# Patient Record
Sex: Female | Born: 1950 | Race: White | Hispanic: No
Health system: Southern US, Community
[De-identification: ages and names within clinical notes are randomized; demographics above are authoritative.]

## PROBLEM LIST (undated history)

## (undated) DIAGNOSIS — D709 Neutropenia, unspecified: Secondary | ICD-10-CM

## (undated) DIAGNOSIS — I4891 Unspecified atrial fibrillation: Secondary | ICD-10-CM

## (undated) DIAGNOSIS — J449 Chronic obstructive pulmonary disease, unspecified: Secondary | ICD-10-CM

## (undated) DIAGNOSIS — F259 Schizoaffective disorder, unspecified: Secondary | ICD-10-CM

## (undated) DIAGNOSIS — K219 Gastro-esophageal reflux disease without esophagitis: Secondary | ICD-10-CM

---

## 2021-12-15 ENCOUNTER — Encounter (HOSPITAL_COMMUNITY): Payer: Self-pay

## 2021-12-15 ENCOUNTER — Emergency Department (HOSPITAL_COMMUNITY): Payer: Medicare Other

## 2021-12-15 ENCOUNTER — Emergency Department (HOSPITAL_COMMUNITY)
Admission: EM | Admit: 2021-12-15 | Discharge: 2021-12-15 | Disposition: A | Discharge status: Home / Self Care | Payer: Medicare Other | Attending: Emergency Medicine | Admitting: Emergency Medicine

## 2021-12-15 ENCOUNTER — Other Ambulatory Visit: Payer: Self-pay

## 2021-12-15 DIAGNOSIS — W19XXXA Unspecified fall, initial encounter: Secondary | ICD-10-CM

## 2021-12-15 DIAGNOSIS — N3000 Acute cystitis without hematuria: Secondary | ICD-10-CM | POA: Insufficient documentation

## 2021-12-15 DIAGNOSIS — R739 Hyperglycemia, unspecified: Secondary | ICD-10-CM | POA: Diagnosis not present

## 2021-12-15 DIAGNOSIS — N179 Acute kidney failure, unspecified: Secondary | ICD-10-CM | POA: Insufficient documentation

## 2021-12-15 DIAGNOSIS — R531 Weakness: Secondary | ICD-10-CM | POA: Diagnosis not present

## 2021-12-15 DIAGNOSIS — W01198A Fall on same level from slipping, tripping and stumbling with subsequent striking against other object, initial encounter: Secondary | ICD-10-CM | POA: Insufficient documentation

## 2021-12-15 DIAGNOSIS — R112 Nausea with vomiting, unspecified: Secondary | ICD-10-CM | POA: Diagnosis present

## 2021-12-15 LAB — URINALYSIS, ROUTINE W REFLEX MICROSCOPIC
Bilirubin Urine: NEGATIVE
Glucose, UA: NEGATIVE mg/dL
Hgb urine dipstick: NEGATIVE
Ketones, ur: NEGATIVE mg/dL
Nitrite: NEGATIVE
Protein, ur: NEGATIVE mg/dL
Specific Gravity, Urine: 1.013 (ref 1.005–1.030)
pH: 6 (ref 5.0–8.0)

## 2021-12-15 LAB — CBC WITH DIFFERENTIAL/PLATELET
Abs Immature Granulocytes: 0.02 10*3/uL (ref 0.00–0.07)
Basophils Absolute: 0.1 10*3/uL (ref 0.0–0.1)
Basophils Relative: 1 %
Eosinophils Absolute: 0.1 10*3/uL (ref 0.0–0.5)
Eosinophils Relative: 1 %
HCT: 43.5 % (ref 36.0–46.0)
Hemoglobin: 14.3 g/dL (ref 12.0–15.0)
Immature Granulocytes: 0 %
Lymphocytes Relative: 16 %
Lymphs Abs: 1.3 10*3/uL (ref 0.7–4.0)
MCH: 32.1 pg (ref 26.0–34.0)
MCHC: 32.9 g/dL (ref 30.0–36.0)
MCV: 97.8 fL (ref 80.0–100.0)
Monocytes Absolute: 0.6 10*3/uL (ref 0.1–1.0)
Monocytes Relative: 8 %
Neutro Abs: 6.1 10*3/uL (ref 1.7–7.7)
Neutrophils Relative %: 74 %
Platelets: 183 10*3/uL (ref 150–400)
RBC: 4.45 MIL/uL (ref 3.87–5.11)
RDW: 14.2 % (ref 11.5–15.5)
WBC: 8.1 10*3/uL (ref 4.0–10.5)
nRBC: 0 % (ref 0.0–0.2)

## 2021-12-15 LAB — COMPREHENSIVE METABOLIC PANEL
ALT: 12 U/L (ref 0–44)
AST: 22 U/L (ref 15–41)
Albumin: 3.8 g/dL (ref 3.5–5.0)
Alkaline Phosphatase: 53 U/L (ref 38–126)
Anion gap: 8 (ref 5–15)
BUN: 22 mg/dL (ref 8–23)
CO2: 23 mmol/L (ref 22–32)
Calcium: 9.4 mg/dL (ref 8.9–10.3)
Chloride: 111 mmol/L (ref 98–111)
Creatinine, Ser: 1.42 mg/dL — ABNORMAL HIGH (ref 0.44–1.00)
GFR, Estimated: 39 mL/min — ABNORMAL LOW (ref >60)
Glucose, Bld: 132 mg/dL — ABNORMAL HIGH (ref 70–99)
Potassium: 3.7 mmol/L (ref 3.5–5.1)
Sodium: 142 mmol/L (ref 135–145)
Total Bilirubin: 0.9 mg/dL (ref 0.3–1.2)
Total Protein: 7.2 g/dL (ref 6.5–8.1)

## 2021-12-15 LAB — LIPASE, BLOOD: Lipase: 23 U/L (ref 11–51)

## 2021-12-15 MED ORDER — CEPHALEXIN 500 MG PO CAPS: 500.0000 mg | ORAL_CAPSULE | Freq: Two times a day (BID) | ORAL | 0 refills | Status: DC

## 2021-12-15 MED ORDER — SODIUM CHLORIDE 0.9 % IV BOLUS: 1000.0000 mL | Freq: Once | INTRAVENOUS | Status: DC

## 2021-12-15 MED ORDER — IOHEXOL 300 MG/ML  SOLN
80.0000 mL | Freq: Once | INTRAMUSCULAR | Status: AC | PRN
  Administered 2021-12-15: 80 mL via INTRAVENOUS

## 2021-12-15 MED ORDER — SODIUM CHLORIDE (PF) 0.9 % IJ SOLN
INTRAMUSCULAR | Status: AC
  Filled 2021-12-15: qty 50

## 2021-12-15 NOTE — ED Notes (Signed)
Pt had another loose bowel movement, cleaned and pads placed under patient

## 2021-12-15 NOTE — ED Triage Notes (Signed)
BIB EMS from [facility]) for a fall, EMS stated she was covered in stool, states Pt hit head, no blood thinners, patient c/o weakness and states she feels off. Hx Afib  CBG 126

## 2021-12-15 NOTE — ED Provider Notes (Signed)
[facility] DEPT Provider Note   CSN: [id number] Arrival date & time: 12/15/21  0335     History  Chief Complaint  Patient presents with   Deanna Rhodes is a 71 y.o. female.  HPI  Without medical history presents from living facility due to a fall.  Patient states that she has not been feeling well for the last 2 days, she states that she had episodes of significant diarrhea, states is very watery, states on her way to the bathroom she felt weak and fell, states she fell onto her right side, she states that she hit her head but denies losing conscious, she is not on anticoag's.  She denies hematochezia or melena, no history of C. difficile no recent antibiotics no recent hospitalizations, She has felt nauseous and had episode of vomiting today denies hematemesis or coffee-ground emesis, she denies any stomach pain, no significant abdominal surgeries, she denies URI-like symptoms no fevers chills general body aches.  She has no other complaints.  She does not endorse any change in vision paresthesias or weakness of her lower extremities denies any neck back pain chest pain pain in the upper extremities or lower extremities.  Spoke to nursing staff at [facility] that patient had a fall, it was unwitnessed, she was found in the bathroom, there is no loss of conscious, they states the patient has been acting her normal self, has been eating and drink outwithout difficulty, they state that patient informed them that she felt weak to start today.  Home Medications Prior to Admission medications   Medication Sig Start Date End Date Taking? Authorizing Provider  cephALEXin (KEFLEX) 500 MG capsule Take 1 capsule (500 mg total) by mouth 2 (two) times daily for 7 days. 12/15/21 12/22/21 Yes Marcello Fennel, PA-C      Allergies    Patient has no known allergies.    Review of Systems   Review of Systems  Constitutional:  Negative for chills  and fever.  Respiratory:  Negative for shortness of breath.   Cardiovascular:  Negative for chest pain.  Gastrointestinal:  Positive for diarrhea, nausea and vomiting. Negative for abdominal pain.  Neurological:  Positive for weakness. Negative for headaches.   Physical Exam Updated Vital Signs BP 97/62   Pulse 87   Temp 97.9 F (36.6 C) (Oral)   Resp 17   Ht '5\' 5"'  (1.651 m)   Wt 71.7 kg   SpO2 95%   BMI 26.29 kg/m  Physical Exam Vitals and nursing note reviewed.  Constitutional:      General: She is not in acute distress.    Appearance: She is not ill-appearing.  HENT:     Head: Normocephalic and atraumatic.     Comments: No deformity of the head present no raccoon eyes or battle sign noted.    Nose: No congestion.     Mouth/Throat:     Mouth: Mucous membranes are moist.     Pharynx: Oropharynx is clear. No oropharyngeal exudate or posterior oropharyngeal erythema.     Comments: No trismus no torticollis no oral trauma present. Eyes:     Extraocular Movements: Extraocular movements intact.     Conjunctiva/sclera: Conjunctivae normal.     Pupils: Pupils are equal, round, and reactive to light.  Cardiovascular:     Rate and Rhythm: Normal rate and regular rhythm.     Pulses: Normal pulses.     Heart sounds: No murmur heard.   No  friction rub. No gallop.  Pulmonary:     Effort: No respiratory distress.     Breath sounds: No wheezing, rhonchi or rales.  Abdominal:     Palpations: Abdomen is soft.     Tenderness: There is abdominal tenderness. There is no right CVA tenderness or left CVA tenderness.     Comments: Nondistended normal bowel sounds dull to percussion, has noted epigastric tenderness, no guarding rebound as or peritoneal sign negative Murphy sign McBurney point.  Musculoskeletal:     Comments: Spine was palpated tender lower lumbar region no step-off or deformities noted.  No pelvis instability no leg shortening  Skin:    General: Skin is warm and dry.   Neurological:     Mental Status: She is alert.     Comments: Alert and orient x4.  No facial asymmetry no difficulty with word finding following two-step commands no unilateral weakness present.  Psychiatric:        Mood and Affect: Mood normal.    ED Results / Procedures / Treatments   Labs (all labs ordered are listed, but only abnormal results are displayed) Labs Reviewed  COMPREHENSIVE METABOLIC PANEL - Abnormal; Notable for the following components:      Result Value   Glucose, Bld 132 (*)    Creatinine, Ser 1.42 (*)    GFR, Estimated 39 (*)    All other components within normal limits  URINALYSIS, ROUTINE W REFLEX MICROSCOPIC - Abnormal; Notable for the following components:   APPearance HAZY (*)    Leukocytes,Ua LARGE (*)    Bacteria, UA MANY (*)    All other components within normal limits  URINE CULTURE  CBC WITH DIFFERENTIAL/PLATELET  LIPASE, BLOOD    EKG EKG Interpretation  Date/Time:  Thursday Dec 15 2021 04:44:35 EDT Ventricular Rate:  79 PR Interval:  156 QRS Duration: 100 QT Interval:  385 QTC Calculation: 442 R Axis:   -64 Text Interpretation: Sinus rhythm Supraventricular bigeminy Left anterior fascicular block Abnormal R-wave progression, early transition LVH with secondary repolarization abnormality Anterior Q waves, possibly due to LVH No old tracing to compare Confirmed by Calvert Cantor [id number]) on 12/15/2021 5:21:22 AM  Radiology CT Head Wo Contrast  Result Date: 12/15/2021 CLINICAL DATA:  71 year old female status post fall at nursing home. Weakness. EXAM: CT HEAD WITHOUT CONTRAST TECHNIQUE: Contiguous axial images were obtained from the base of the skull through the vertex without intravenous contrast. RADIATION DOSE REDUCTION: This exam was performed according to the departmental dose-optimization program which includes automated exposure control, adjustment of the mA and/or kV according to patient size and/or use of iterative reconstruction  technique. COMPARISON:  None Available. FINDINGS: Brain: Cerebral volume is within normal limits for age. No midline shift, ventriculomegaly, mass effect, evidence of mass lesion, intracranial hemorrhage or evidence of cortically based acute infarction. Minimal to mild for age scattered cerebral white matter hypodensity, mostly subcortical. Mild involvement right anterior internal capsule. Gray-white matter differentiation otherwise within normal limits. Vascular: Minimal Calcified atherosclerosis at the skull base. No suspicious intracranial vascular hyperdensity. Skull: No fracture identified. Hyperostosis of the calvarium, normal variant. Sinuses/Orbits: Paranasal sinuses, tympanic cavities and mastoids are well aerated. Other: No orbit or scalp soft tissue injury identified. IMPRESSION: 1. No acute intracranial abnormality or acute traumatic injury identified. 2. Minimal to mild for age cerebral white matter changes, most commonly due to chronic small vessel disease. Electronically Signed   By: Genevie Ann M.D.   On: 12/15/2021 06:31   CT ABDOMEN PELVIS W  CONTRAST  Result Date: 12/15/2021 CLINICAL DATA:  71 year old female with history of trauma from a fall at a nursing home. Epigastric pain. Weakness. EXAM: CT ABDOMEN AND PELVIS WITH CONTRAST CT LUMBAR SPINE WITHOUT CONTRAST TECHNIQUE: Multidetector CT imaging of the abdomen and pelvis was performed using the standard protocol following bolus administration of intravenous contrast. Additional multiplanar reformats were generated through the lumbar spine for interpretation. RADIATION DOSE REDUCTION: This exam was performed according to the departmental dose-optimization program which includes automated exposure control, adjustment of the mA and/or kV according to patient size and/or use of iterative reconstruction technique. CONTRAST:  20m OMNIPAQUE IOHEXOL 300 MG/ML  SOLN COMPARISON:  No priors. FINDINGS: Lower chest: Densely calcified bilateral breast  implants are incidentally noted. Scattered areas of scarring are noted in the lung bases bilaterally. Hepatobiliary: No suspicious cystic or solid hepatic lesions. No intra or extrahepatic biliary ductal dilatation. Status post cholecystectomy. Pancreas: No pancreatic mass. No pancreatic ductal dilatation. No pancreatic or peripancreatic fluid collections or inflammatory changes. Spleen: Small calcified granulomas in the spleen. Adrenals/Urinary Tract: Mild renal atrophy bilaterally. No suspicious renal lesions. No hydroureteronephrosis. Urinary bladder is normal in appearance. Bilateral adrenal glands are normal in appearance. Stomach/Bowel: The appearance of the stomach is normal. There is no pathologic dilatation of small bowel or colon. Postoperative changes of partial bowel resection are noted at the ileocolic junction. Appendix is not confidently identified may be surgically absent. Vascular/Lymphatic: Aortic atherosclerosis, without evidence of aneurysm or dissection in the abdominal or pelvic vasculature. No lymphadenopathy noted in the abdomen or pelvis. Reproductive: Uterus and ovaries are atrophic. Other: No high attenuation fluid collection in the peritoneal cavity or retroperitoneum to suggest significant posttraumatic hemorrhage. No significant volume of ascites. No pneumoperitoneum. Musculoskeletal: No acute abnormality of the lumbar spine. Specifically, no acute displaced fracture, compression type fracture or malalignment. There are no aggressive appearing lytic or blastic lesions noted in the visualized portions of the skeleton. IMPRESSION: 1. No evidence of significant acute traumatic injury to the lumbar spine. 2. No acute findings are noted in the abdomen or pelvis to account for the patient's symptoms. 3. Aortic atherosclerosis. 4. Additional incidental findings, as above. Electronically Signed   By: DVinnie LangtonM.D.   On: 12/15/2021 06:47   CT L-SPINE NO CHARGE  Result Date:  12/15/2021 CLINICAL DATA:  71year old female with history of trauma from a fall at a nursing home. Epigastric pain. Weakness. EXAM: CT ABDOMEN AND PELVIS WITH CONTRAST CT LUMBAR SPINE WITHOUT CONTRAST TECHNIQUE: Multidetector CT imaging of the abdomen and pelvis was performed using the standard protocol following bolus administration of intravenous contrast. Additional multiplanar reformats were generated through the lumbar spine for interpretation. RADIATION DOSE REDUCTION: This exam was performed according to the departmental dose-optimization program which includes automated exposure control, adjustment of the mA and/or kV according to patient size and/or use of iterative reconstruction technique. CONTRAST:  878mOMNIPAQUE IOHEXOL 300 MG/ML  SOLN COMPARISON:  No priors. FINDINGS: Lower chest: Densely calcified bilateral breast implants are incidentally noted. Scattered areas of scarring are noted in the lung bases bilaterally. Hepatobiliary: No suspicious cystic or solid hepatic lesions. No intra or extrahepatic biliary ductal dilatation. Status post cholecystectomy. Pancreas: No pancreatic mass. No pancreatic ductal dilatation. No pancreatic or peripancreatic fluid collections or inflammatory changes. Spleen: Small calcified granulomas in the spleen. Adrenals/Urinary Tract: Mild renal atrophy bilaterally. No suspicious renal lesions. No hydroureteronephrosis. Urinary bladder is normal in appearance. Bilateral adrenal glands are normal in appearance. Stomach/Bowel: The appearance of the  stomach is normal. There is no pathologic dilatation of small bowel or colon. Postoperative changes of partial bowel resection are noted at the ileocolic junction. Appendix is not confidently identified may be surgically absent. Vascular/Lymphatic: Aortic atherosclerosis, without evidence of aneurysm or dissection in the abdominal or pelvic vasculature. No lymphadenopathy noted in the abdomen or pelvis. Reproductive: Uterus and  ovaries are atrophic. Other: No high attenuation fluid collection in the peritoneal cavity or retroperitoneum to suggest significant posttraumatic hemorrhage. No significant volume of ascites. No pneumoperitoneum. Musculoskeletal: No acute abnormality of the lumbar spine. Specifically, no acute displaced fracture, compression type fracture or malalignment. There are no aggressive appearing lytic or blastic lesions noted in the visualized portions of the skeleton. IMPRESSION: 1. No evidence of significant acute traumatic injury to the lumbar spine. 2. No acute findings are noted in the abdomen or pelvis to account for the patient's symptoms. 3. Aortic atherosclerosis. 4. Additional incidental findings, as above. Electronically Signed   By: Vinnie Langton M.D.   On: 12/15/2021 06:47    Procedures Procedures    Medications Ordered in ED Medications  sodium chloride (PF) 0.9 % injection (has no administration in time range)  sodium chloride 0.9 % bolus 1,000 mL (has no administration in time range)  iohexol (OMNIPAQUE) 300 MG/ML solution 80 mL (80 mLs Intravenous Contrast Given 12/15/21 1638)    ED Course/ Medical Decision Making/ A&P                           Medical Decision Making Amount and/or Complexity of Data Reviewed Labs: ordered. Radiology: ordered.  Risk Prescription drug management.   This patient presents to the ED for concern of fall, this involves an extensive number of treatment options, and is a complaint that carries with it a high risk of complications and morbidity.  The differential diagnosis includes intracranial head bleed, sepsis, electrode derailments    Additional history obtained:  Additional history obtained from nursing staff External records from outside source obtained and reviewed including N/A   Co morbidities that complicate the patient evaluation  N/A  Social Determinants of Health:  Geriatric    Lab Tests:  I Ordered, and personally  interpreted labs.  The pertinent results include: CBC unremarkable, CMP shows glucose of 132 creatinine 1.4, lipase 23, UA shows leukocytes, white blood cells many bacteria, urine culture pending   Imaging Studies ordered:  I ordered imaging studies including CT head, CT lumbar spine, CT abdomen pelvis I independently visualized and interpreted imaging which showed CT head negative acute findings, CT abdomen pelvis as well as lumbar spine negative for acute findings. I agree with the radiologist interpretation   Cardiac Monitoring:  The patient was maintained on a cardiac monitor.  I personally viewed and interpreted the cardiac monitored which showed an underlying rhythm of: EKG without signs of ischemia   Medicines ordered and prescription drug management:  I ordered medication including N/A I have reviewed the patients home medicines and have made adjustments as needed  Critical Interventions:  N/A   Reevaluation:  Presents with a fall, abdomen slight tender my exam, as well as tenderness along her lumbar spine, will obtain imaging and lab work and reassess.  CMP shows increase in creatinine she did appear dehydrated my exam will provide with fluids, UA is consistent with UTI will culture urine.  Update on lab or imaging having no other complaints agreeable for discharge.    Consultations Obtained:  N/A  Considered:  Admission-we will defer admission at this time as patient is nontoxic-appearing vital signs reassuring no leukocytosis, she is not encephalopathic from a UTI.  Will attempt outpatient therapy.  Patient agreement this plan.    Rule out low suspicion for intracranial head bleed and/or CVA as patient denies loss of conscious, is not on anticoagulant, she does not endorse headaches, paresthesia/weakness in the upper and lower extremities, no focal deficits present on my exam CT head negative acute findings..  Low suspicion for spinal cord abnormality or  spinal fracture as patient has full range of motion in the upper and lower extremities CT lumbar spine negative acute findings.  Low suspicion for ACS as patient denies any chest pain shortness of breath EKG without signs of ischemia.  I have low suspicion for pancreatitis as lipase within normal limits.  Low suspicion for biliary abnormality as there is no elevation liver enzymes alk phos T. bili.  Low suspicion for bowel obstruction volvulus intra-abdominal infection CT on pelvis negative for these findings.    Dispostion and problem list  Discharge  Weakness-likely secondary due to UTI, started on antibiotics, follow-up with PCP for further evaluation. AKI-given a liter of fluids, recheck creatinine weeks time for reevaluation.  By PCP.            Final Clinical Impression(s) / ED Diagnoses Final diagnoses:  Fall, initial encounter  Acute cystitis without hematuria  AKI (acute kidney injury) [facility])    Rx / DC Orders ED Discharge Orders          Ordered    cephALEXin (KEFLEX) 500 MG capsule  2 times daily        12/15/21 0700              Marcello Fennel, PA-C 12/15/21 3704    Truddie Hidden, MD 12/15/21 2255

## 2021-12-15 NOTE — ED Notes (Signed)
Son in law called to get information/update

## 2021-12-15 NOTE — ED Notes (Signed)
Pt arrived from facility covered in feces down to her feet, cleaned and brief placed on patient

## 2021-12-15 NOTE — Discharge Instructions (Addendum)
UTI-started on antibiotics take as prescribed please follow-up with PCP in weeks time for recheck of your urine. AKI-given a liter of fluid in the emergency department please continue to hydrate, follow-up with your PCP recheck creatinine.   Come back to the emergency department if you develop chest pain, shortness of breath, severe abdominal pain, uncontrolled nausea, vomiting, diarrhea.

## 2021-12-15 NOTE — ED Notes (Signed)
[facility] transportation set up

## 2021-12-17 LAB — URINE CULTURE: Culture: >=100000 — AB

## 2021-12-18 ENCOUNTER — Telehealth (HOSPITAL_BASED_OUTPATIENT_CLINIC_OR_DEPARTMENT_OTHER): Payer: Self-pay | Admitting: *Deleted

## 2021-12-18 NOTE — Telephone Encounter (Signed)
Post ED Visit - Positive Culture Follow-up  Culture report reviewed by antimicrobial stewardship pharmacist: [facility] []  , Pharm.D. []  Enzo Bi, .D., BCPS AQ-ID []  Celedonio Miyamoto, Pharm.D., BCPS []  [address], Pharm.D., BCPS []  [place], Garvin Fila.D., BCPS, AAHIVP []  , Pharm.D., BCPS, AAHIVP []  Georgina Pillion, PharmD, BCPS []  , PharmD, BCPS []  [place], PharmD, BCPS []  [place], PharmD []  , PharmD, BCPS []  Estella Husk, PharmD  Pharmacy Team []  Lysle Pearl, PharmD []  , PharmD []  Phillips Climes, PharmD []  , Rph []  Agapito Games) , PharmD []  Verlan Friends, PharmD []  , PharmD []  Mervyn Gay, PharmD []  , PharmD []  Vinnie Level, PharmD []  [facility], PharmD []  , PharmD [x]  Len Childs, PharmD   Positive urine culture Treated with Cephalexin, organism sensitive to the same and no further patient follow-up is required at this time.  12/18/2021, 2:09 PM

## 2021-12-20 ENCOUNTER — Telehealth (HOSPITAL_COMMUNITY): Payer: Self-pay | Admitting: Emergency Medicine

## 2021-12-20 MED ORDER — CEPHALEXIN 500 MG PO CAPS: 500.0000 mg | ORAL_CAPSULE | Freq: Two times a day (BID) | ORAL | 0 refills | Status: AC

## 2021-12-20 NOTE — Telephone Encounter (Signed)
The patient was recently in the hospital and was found to have a urinary tract infection that was culture positive.  She unfortunately did not receive her paper prescription for her antibiotic this was actually 4 days ago.  Her culture came back to 48 hours ago.  We received a call from the nursing facility that she never received her antibiotic.  We will represcribe today.

## 2022-01-24 ENCOUNTER — Encounter (HOSPITAL_COMMUNITY): Payer: Self-pay

## 2022-01-24 ENCOUNTER — Other Ambulatory Visit: Payer: Self-pay

## 2022-01-24 ENCOUNTER — Emergency Department (HOSPITAL_COMMUNITY)
Admission: EM | Admit: 2022-01-24 | Discharge: 2022-01-26 | Disposition: A | Discharge status: Other Acute Inpatient Hospital | Payer: Medicare Other | Attending: Emergency Medicine | Admitting: Emergency Medicine

## 2022-01-24 DIAGNOSIS — N390 Urinary tract infection, site not specified: Secondary | ICD-10-CM | POA: Insufficient documentation

## 2022-01-24 DIAGNOSIS — B9620 Unspecified Escherichia coli [E. coli] as the cause of diseases classified elsewhere: Secondary | ICD-10-CM | POA: Diagnosis not present

## 2022-01-24 DIAGNOSIS — I444 Left anterior fascicular block: Secondary | ICD-10-CM | POA: Insufficient documentation

## 2022-01-24 DIAGNOSIS — R45850 Homicidal ideations: Secondary | ICD-10-CM | POA: Diagnosis not present

## 2022-01-24 DIAGNOSIS — Z20822 Contact with and (suspected) exposure to covid-19: Secondary | ICD-10-CM | POA: Insufficient documentation

## 2022-01-24 DIAGNOSIS — R440 Auditory hallucinations: Secondary | ICD-10-CM | POA: Diagnosis not present

## 2022-01-24 DIAGNOSIS — F259 Schizoaffective disorder, unspecified: Secondary | ICD-10-CM

## 2022-01-24 DIAGNOSIS — R441 Visual hallucinations: Secondary | ICD-10-CM | POA: Insufficient documentation

## 2022-01-24 DIAGNOSIS — I4891 Unspecified atrial fibrillation: Secondary | ICD-10-CM | POA: Diagnosis not present

## 2022-01-24 DIAGNOSIS — F250 Schizoaffective disorder, bipolar type: Secondary | ICD-10-CM | POA: Insufficient documentation

## 2022-01-24 DIAGNOSIS — Z0279 Encounter for issue of other medical certificate: Secondary | ICD-10-CM | POA: Diagnosis not present

## 2022-01-24 DIAGNOSIS — J449 Chronic obstructive pulmonary disease, unspecified: Secondary | ICD-10-CM | POA: Insufficient documentation

## 2022-01-24 DIAGNOSIS — Z1623 Resistance to quinolones and fluoroquinolones: Secondary | ICD-10-CM | POA: Diagnosis not present

## 2022-01-24 DIAGNOSIS — F99 Mental disorder, not otherwise specified: Secondary | ICD-10-CM | POA: Diagnosis not present

## 2022-01-24 HISTORY — DX: Chronic obstructive pulmonary disease, unspecified: J44.9

## 2022-01-24 HISTORY — DX: Schizoaffective disorder, unspecified: F25.9

## 2022-01-24 HISTORY — DX: Neutropenia, unspecified: D70.9

## 2022-01-24 HISTORY — DX: Gastro-esophageal reflux disease without esophagitis: K21.9

## 2022-01-24 HISTORY — DX: Unspecified atrial fibrillation: I48.91

## 2022-01-24 LAB — CBC WITH DIFFERENTIAL/PLATELET
Abs Immature Granulocytes: 0.01 10*3/uL (ref 0.00–0.07)
Basophils Absolute: 0.1 10*3/uL (ref 0.0–0.1)
Basophils Relative: 1 %
Eosinophils Absolute: 0.1 10*3/uL (ref 0.0–0.5)
Eosinophils Relative: 1 %
HCT: 41.4 % (ref 36.0–46.0)
Hemoglobin: 13.1 g/dL (ref 12.0–15.0)
Immature Granulocytes: 0 %
Lymphocytes Relative: 34 %
Lymphs Abs: 2.1 10*3/uL (ref 0.7–4.0)
MCH: 32.7 pg (ref 26.0–34.0)
MCHC: 31.6 g/dL (ref 30.0–36.0)
MCV: 103.2 fL — ABNORMAL HIGH (ref 80.0–100.0)
Monocytes Absolute: 0.7 10*3/uL (ref 0.1–1.0)
Monocytes Relative: 11 %
Neutro Abs: 3.2 10*3/uL (ref 1.7–7.7)
Neutrophils Relative %: 53 %
Platelets: 185 10*3/uL (ref 150–400)
RBC: 4.01 MIL/uL (ref 3.87–5.11)
RDW: 14.1 % (ref 11.5–15.5)
WBC: 6.1 10*3/uL (ref 4.0–10.5)
nRBC: 0 % (ref 0.0–0.2)

## 2022-01-24 LAB — RAPID URINE DRUG SCREEN, HOSP PERFORMED
Amphetamines: NOT DETECTED
Barbiturates: NOT DETECTED
Benzodiazepines: NOT DETECTED
Cocaine: NOT DETECTED
Opiates: NOT DETECTED
Tetrahydrocannabinol: NOT DETECTED

## 2022-01-24 LAB — RESP PANEL BY RT-PCR (FLU A&B, COVID) ARPGX2
Influenza A by PCR: NEGATIVE
Influenza B by PCR: NEGATIVE
SARS Coronavirus 2 by RT PCR: NEGATIVE

## 2022-01-24 LAB — URINALYSIS, ROUTINE W REFLEX MICROSCOPIC
Bilirubin Urine: NEGATIVE
Glucose, UA: NEGATIVE mg/dL
Ketones, ur: NEGATIVE mg/dL
Nitrite: POSITIVE — AB
Protein, ur: NEGATIVE mg/dL
Specific Gravity, Urine: 1.006 (ref 1.005–1.030)
WBC, UA: >50 WBC/hpf — ABNORMAL HIGH (ref 0–5)
pH: 6 (ref 5.0–8.0)

## 2022-01-24 LAB — ETHANOL: Alcohol, Ethyl (B): <10 mg/dL (ref <10)

## 2022-01-24 MED ORDER — SERTRALINE HCL 50 MG PO TABS: 25.0000 mg | ORAL_TABLET | Freq: Every morning | ORAL | Status: DC

## 2022-01-24 MED ORDER — BENZTROPINE MESYLATE 1 MG PO TABS
1.0000 mg | ORAL_TABLET | Freq: Two times a day (BID) | ORAL | Status: DC
  Administered 2022-01-24 – 2022-01-26 (×4): 1 mg via ORAL
  Filled 2022-01-24 (×4): qty 1

## 2022-01-24 MED ORDER — OLANZAPINE 10 MG PO TBDP
10.0000 mg | ORAL_TABLET | Freq: Every day | ORAL | Status: DC
Start: 2022-01-24 — End: 2022-01-27
  Administered 2022-01-24 – 2022-01-25 (×2): 10 mg via ORAL
  Filled 2022-01-24 (×2): qty 1

## 2022-01-24 MED ORDER — BENZTROPINE MESYLATE 0.5 MG PO TABS: 1.0000 mg | ORAL_TABLET | Freq: Two times a day (BID) | ORAL | Status: DC

## 2022-01-24 MED ORDER — CLOZAPINE 100 MG PO TABS
100.0000 mg | ORAL_TABLET | Freq: Every day | ORAL | Status: DC
  Administered 2022-01-24 – 2022-01-25 (×2): 100 mg via ORAL
  Filled 2022-01-24 (×5): qty 1

## 2022-01-24 MED ORDER — THIAMINE HCL 100 MG PO TABS
100.0000 mg | ORAL_TABLET | Freq: Every day | ORAL | Status: DC
Start: 2022-01-24 — End: 2022-01-27
  Administered 2022-01-24 – 2022-01-26 (×3): 100 mg via ORAL
  Filled 2022-01-24 (×3): qty 1

## 2022-01-24 MED ORDER — ZOLPIDEM TARTRATE 5 MG PO TABS
5.0000 mg | ORAL_TABLET | Freq: Every evening | ORAL | Status: DC | PRN
  Filled 2022-01-24: qty 1

## 2022-01-24 MED ORDER — ACETAMINOPHEN 325 MG PO TABS: 650.0000 mg | ORAL_TABLET | ORAL | Status: DC | PRN

## 2022-01-24 MED ORDER — ZIPRASIDONE MESYLATE 20 MG IM SOLR: 20.0000 mg | INTRAMUSCULAR | Status: DC | PRN

## 2022-01-24 MED ORDER — CLOZAPINE 25 MG PO TABS
50.0000 mg | ORAL_TABLET | Freq: Every day | ORAL | Status: DC
  Administered 2022-01-25 – 2022-01-26 (×2): 50 mg via ORAL
  Filled 2022-01-24 (×2): qty 2

## 2022-01-24 MED ORDER — CEPHALEXIN 500 MG PO CAPS
500.0000 mg | ORAL_CAPSULE | Freq: Three times a day (TID) | ORAL | Status: DC
  Administered 2022-01-24 – 2022-01-26 (×7): 500 mg via ORAL
  Filled 2022-01-24 (×7): qty 1

## 2022-01-24 MED ORDER — RISPERIDONE 0.5 MG PO TBDP
2.0000 mg | ORAL_TABLET | Freq: Three times a day (TID) | ORAL | Status: DC | PRN
  Administered 2022-01-24: 2 mg via ORAL
  Filled 2022-01-24: qty 4

## 2022-01-24 MED ORDER — LORAZEPAM 1 MG PO TABS
1.0000 mg | ORAL_TABLET | ORAL | Status: AC | PRN
  Administered 2022-01-26: 1 mg via ORAL
  Filled 2022-01-24: qty 1

## 2022-01-24 MED ORDER — DIVALPROEX SODIUM 125 MG PO CSDR
500.0000 mg | DELAYED_RELEASE_CAPSULE | Freq: Two times a day (BID) | ORAL | Status: DC
  Administered 2022-01-24 – 2022-01-26 (×5): 500 mg via ORAL
  Filled 2022-01-24 (×5): qty 4

## 2022-01-24 MED ORDER — ONDANSETRON HCL 4 MG PO TABS: 4.0000 mg | ORAL_TABLET | Freq: Three times a day (TID) | ORAL | Status: DC | PRN

## 2022-01-24 MED ORDER — ALUM & MAG HYDROXIDE-SIMETH 200-200-20 MG/5ML PO SUSP: 30.0000 mL | Freq: Four times a day (QID) | ORAL | Status: DC | PRN

## 2022-01-24 NOTE — ED Triage Notes (Signed)
Pt bib ems from [facility] for behavioral concerns from staff.  Per ems, pt making HI statements towards family.  Pt hx of schizoaffective disorder, staff from care facility states pt has been taking meds and given PRN 0.5mg  Ativan prior to arrival to ed.  Pt calm and cooperative w/ ems and ed staff during triage.

## 2022-01-24 NOTE — Progress Notes (Addendum)
PHARMACIST - PHYSICIAN ORDER COMMUNICATION  Deanna Rhodes is a 71 y.o. year old female on Clozapine PTA. Continuing this medication order as an inpatient requires that monitoring parameters per REMS requirements must be met.   Clozapine REMS Dispense Authorization was obtained, and will dispense inpatient.  RDA code [id number].  Verified Clozapine dose: 50 mg AM, 100 mg HS - per MAR from facility.  Last ANC value and date reported on the Clozapine REMS website: 3200, 01/24/2022 ANC monitoring frequency: weekly Next ANC reporting is due on (date) 01/31/2022.  Electa Sniff K 01/24/2022, 2:39 PM

## 2022-01-24 NOTE — Consult Note (Signed)
[facility] ED ASSESSMENT   Reason for Consult:  Psychiatry evaluation Referring Physician:  ER Physician Patient Identification: Deanna Rhodes MRN:  [id number] ED Chief Complaint: Schizoaffective disorder, bipolar type (HCC)  Diagnosis:  Principal Problem:   Schizoaffective disorder, bipolar type [facility])   ED Assessment Time Calculation: Start Time: 1504 Stop Time: 1535 Total Time in Minutes (Assessment Completion): 31   Subjective:   Deanna Rhodes is a 71 y.o. female patient admitted with significant hx of  Dementia, Schizoaffective disorder, Bipolar type, Depression and anxiety  was brought in by EMS from [facility] for Nursing and Rehabilitation for agitation, anger, irritability and threatening daughter.  She has had three previous inpatient Geriatric mental health admissions and last was last October to Feb this year.when  HPI:  On approach patient was wandering the ER hall and nursing staff took her back to her stretcher.  Patient was able to state her first name, the Nursing facility she is at and that stated Ambulance brought her to the hospital.  She did not know the name of the hospital, the date, month and year.  She suddenly stopped talking and informed provider she cannot talk anymore. Daughter, Deanna Rhodes was called who reported that her mother is manic now and this is how she behaves when manic-agitated, cursing, angry, delusion and she constantly threatens to hurt herself.  She attempted suicide in the past by OD and self harm-cut self.  Daughter reported that her mother is well known at [facility] Ger[facility] unit and that she begged the ambulance people to take her there.  She reported that her mother also has diagnosis of Dementia as well.  Of note patient has UTI and has not been treated for that. Staff from Nursing facility [facility] reported that for a week patient has been given Ativan for agitation but she remains manic stating that she is talking to Devil, threatened to  hurt her daughter, cursing at her and became difficult to redirect.  [facility] reported that patient however is compliant with her medications. We will seek inpatient  Geropsychiatry hospitalization.  We will fax out records while we start treating patient.  We will obtain lab work up tomorrow Morning.  Daughter is legal Guardian and we will seek the record.  Past Psychiatric History: Dementia, Schizoaffective disorder, Bipolar type, Depression and anxiety.  Three previous inpatient Geropsych hospitalization at [facility].  Last hospitalization was October-Feb this year.  Risk to Self or Others: Is the patient at risk to self? No Has the patient been a risk to self in the past 6 months? No Has the patient been a risk to self within the distant past? No Is the patient a risk to others? Yes Has the patient been a risk to others in the past 6 months? Yes Has the patient been a risk to others within the distant past? Yes  [place] Scale:  Flowsheet Row ED from 01/24/2022 in [facility] ED from 12/15/2021 in [facility]  C-SSRS RISK CATEGORY No Risk No Risk       AIMS:  , , ,  ,   ASAM:    Substance Abuse:     Past Medical History:  Past Medical History:  Diagnosis Date   A-fib (HCC)    COPD (chronic obstructive pulmonary disease) (HCC)    GERD (gastroesophageal reflux disease)    Neutropenia (HCC)    Schizoaffective disorder (HCC)    History reviewed. No pertinent surgical history. Family History: History  reviewed. No pertinent family history. Family Psychiatric  History: unknown Social History:  Social History   Substance and Sexual Activity  Alcohol Use None     Social History   Substance and Sexual Activity  Drug Use Not on file    Social History   Socioeconomic History   Marital status: Divorced    Spouse name: Not on file   Number of children: Not on file   Years of education: Not on file    Highest education level: Not on file  Occupational History   Not on file  Tobacco Use   Smoking status: Unknown   Smokeless tobacco: Not on file  Substance and Sexual Activity   Alcohol use: Not on file   Drug use: Not on file   Sexual activity: Not on file  Other Topics Concern   Not on file  Social History Narrative   Not on file   Social Determinants of Health   Financial Resource Strain: Not on file  Food Insecurity: Not on file  Transportation Needs: Not on file  Physical Activity: Not on file  Stress: Not on file  Social Connections: Not on file   Additional Social History:    Allergies:   Allergies  Allergen Reactions   Codeine Other (See Comments)    Listed allergy on MAR Unknown reaction   Imdur [Isosorbide Nitrate] Other (See Comments)    Listed allergy on MAR Unknown reaction    Labs:  Results for orders placed or performed during the hospital encounter of 01/24/22 (from the past 48 hour(s))  Ethanol     Status: None   Collection Time: 01/24/22 12:52 PM  Result Value Ref Range   Alcohol, Ethyl (B) <10 <10 mg/dL    Comment: (NOTE) Lowest detectable limit for serum alcohol is 10 mg/dL.  For medical purposes only. Performed at [facility], [address], [place], [place] [address]   Urine rapid drug screen (hosp performed)     Status: None   Collection Time: 01/24/22 12:52 PM  Result Value Ref Range   Opiates NONE DETECTED NONE DETECTED   Cocaine NONE DETECTED NONE DETECTED   Benzodiazepines NONE DETECTED NONE DETECTED   Amphetamines NONE DETECTED NONE DETECTED   Tetrahydrocannabinol NONE DETECTED NONE DETECTED   Barbiturates NONE DETECTED NONE DETECTED    Comment: (NOTE) DRUG SCREEN FOR MEDICAL PURPOSES ONLY.  IF CONFIRMATION IS NEEDED FOR ANY PURPOSE, NOTIFY LAB WITHIN 5 DAYS.  LOWEST DETECTABLE LIMITS FOR URINE DRUG SCREEN Drug Class                     Cutoff (ng/mL) Amphetamine and metabolites    1000 Barbiturate  and metabolites    200 Benzodiazepine                 200 Tricyclics and metabolites     300 Opiates and metabolites        300 Cocaine and metabolites        300 THC                            50 Performed at [facility], [address] [place], [place] [address]   CBC with Diff     Status: Abnormal   Collection Time: 01/24/22 12:52 PM  Result Value Ref Range   WBC 6.1 4.0 - 10.5 K/uL   RBC 4.01 3.87 - 5.11 MIL/uL   Hemoglobin  13.1 12.0 - 15.0 g/dL   HCT 16.141.4 09.636.0 - 04.546.0 %   MCV 103.2 (H) 80.0 - 100.0 fL   MCH 32.7 26.0 - 34.0 pg   MCHC 31.6 30.0 - 36.0 g/dL   RDW 40.914.1 81.111.5 - 91.415.5 %   Platelets 185 150 - 400 K/uL   nRBC 0.0 0.0 - 0.2 %   Neutrophils Relative % 53 %   Neutro Abs 3.2 1.7 - 7.7 K/uL   Lymphocytes Relative 34 %   Lymphs Abs 2.1 0.7 - 4.0 K/uL   Monocytes Relative 11 %   Monocytes Absolute 0.7 0.1 - 1.0 K/uL   Eosinophils Relative 1 %   Eosinophils Absolute 0.1 0.0 - 0.5 K/uL   Basophils Relative 1 %   Basophils Absolute 0.1 0.0 - 0.1 K/uL   Immature Granulocytes 0 %   Abs Immature Granulocytes 0.01 0.00 - 0.07 K/uL    Comment: Performed at [facility], [address] [place], [place] [address]  Urinalysis, Routine w reflex microscopic Urine, Clean Catch     Status: Abnormal   Collection Time: 01/24/22 12:53 PM  Result Value Ref Range   Color, Urine YELLOW YELLOW   APPearance CLOUDY (A) CLEAR   Specific Gravity, Urine 1.006 1.005 - 1.030   pH 6.0 5.0 - 8.0   Glucose, UA NEGATIVE NEGATIVE mg/dL   Hgb urine dipstick SMALL (A) NEGATIVE   Bilirubin Urine NEGATIVE NEGATIVE   Ketones, ur NEGATIVE NEGATIVE mg/dL   Protein, ur NEGATIVE NEGATIVE mg/dL   Nitrite POSITIVE (A) NEGATIVE   Leukocytes,Ua LARGE (A) NEGATIVE   RBC / HPF 11-20 0 - 5 RBC/hpf   WBC, UA >50 (H) 0 - 5 WBC/hpf   Bacteria, UA MANY (A) NONE SEEN   Squamous Epithelial / LPF 0-5 0 - 5   WBC Clumps PRESENT    Mucus PRESENT    Budding Yeast PRESENT     Crystals PRESENT (A) NEGATIVE    Comment: Performed at [facility], [address] [place], [place] [address]  Resp Panel by RT-PCR (Flu A&B, Covid) Anterior Nasal Swab     Status: None   Collection Time: 01/24/22  1:06 PM   Specimen: Anterior Nasal Swab  Result Value Ref Range   SARS Coronavirus 2 by RT PCR NEGATIVE NEGATIVE    Comment: (NOTE) SARS-CoV-2 target nucleic acids are NOT DETECTED.  The SARS-CoV-2 RNA is generally detectable in upper respiratory specimens during the acute phase of infection. The lowest concentration of SARS-CoV-2 viral copies this assay can detect is 138 copies/mL. A negative result does not preclude SARS-Cov-2 infection and should not be used as the sole basis for treatment or other patient management decisions. A negative result may occur with  improper specimen collection/handling, submission of specimen other than nasopharyngeal swab, presence of viral mutation(s) within the areas targeted by this assay, and inadequate number of viral copies(<138 copies/mL). A negative result must be combined with clinical observations, patient history, and epidemiological information. The expected result is Negative.  Fact Sheet for Patients:  BloggerCourse.comhttps://www.fda.gov/media/152166/download  Fact Sheet for Healthcare Providers:  SeriousBroker.ithttps://www.fda.gov/media/152162/download  This test is no t yet approved or cleared by the [place] FDA and  has been authorized for detection and/or diagnosis of SARS-CoV-2 by FDA under an Emergency Use Authorization (EUA). This EUA will remain  in effect (meaning this test can be used) for the duration of the COVID-19 declaration under Section 564(b)(1) of the Act, 21 U.S.C.section 360bbb-3(b)(1), unless the authorization  is terminated  or revoked sooner.       Influenza A by PCR NEGATIVE NEGATIVE   Influenza B by PCR NEGATIVE NEGATIVE    Comment: (NOTE) The Xpert Xpress SARS-CoV-2/FLU/RSV plus assay is  intended as an aid in the diagnosis of influenza from Nasopharyngeal swab specimens and should not be used as a sole basis for treatment. Nasal washings and aspirates are unacceptable for Xpert Xpress SARS-CoV-2/FLU/RSV testing.  Fact Sheet for Patients: BloggerCourse.com  Fact Sheet for Healthcare Providers: SeriousBroker.it  This test is not yet approved or cleared by the [place] FDA and has been authorized for detection and/or diagnosis of SARS-CoV-2 by FDA under an Emergency Use Authorization (EUA). This EUA will remain in effect (meaning this test can be used) for the duration of the COVID-19 declaration under Section 564(b)(1) of the Act, 21 U.S.C. section 360bbb-3(b)(1), unless the authorization is terminated or revoked.  Performed at [facility], [address] [place], [place] [address]     Current Facility-Administered Medications  Medication Dose Route Frequency Provider Last Rate Last Admin   acetaminophen (TYLENOL) tablet 650 mg  650 mg Oral Q4H PRN Fayrene Helper, PA-C       alum & mag hydroxide-simeth (MAALOX/MYLANTA) 200-200-20 MG/5ML suspension 30 mL  30 mL Oral Q6H PRN Fayrene Helper, PA-C       benztropine (COGENTIN) tablet 1 mg  1 mg Oral BID Benjiman Core, MD       cloZAPine (CLOZARIL) tablet 100 mg  100 mg Oral QHS Fayrene Helper, PA-C       [START ON 01/25/2022] cloZAPine (CLOZARIL) tablet 50 mg  50 mg Oral Daily Fayrene Helper, PA-C       divalproex (DEPAKOTE SPRINKLE) capsule 500 mg  500 mg Oral BID Fayrene Helper, PA-C   500 mg at 01/24/22 1442   risperiDONE (RISPERDAL M-TABS) disintegrating tablet 2 mg  2 mg Oral Q8H PRN Fayrene Helper, PA-C       And   LORazepam (ATIVAN) tablet 1 mg  1 mg Oral PRN Fayrene Helper, PA-C       And   ziprasidone (GEODON) injection 20 mg  20 mg Intramuscular PRN Fayrene Helper, PA-C       OLANZapine zydis (ZYPREXA) disintegrating tablet 10 mg  10 mg Oral QHS Fayrene Helper, PA-C       ondansetron [facility]) tablet 4 mg  4 mg Oral Q8H PRN Fayrene Helper, PA-C       thiamine tablet 100 mg  100 mg Oral Daily Fayrene Helper, PA-C   100 mg at 01/24/22 1443   zolpidem (AMBIEN) tablet 5 mg  5 mg Oral QHS PRN Fayrene Helper, PA-C       Current Outpatient Medications  Medication Sig Dispense Refill   acetaminophen (TYLENOL) 500 MG tablet Take 500 mg by mouth 4 (four) times daily.     benztropine (COGENTIN) 1 MG tablet Take 1 mg by mouth 2 (two) times daily.     Cholestyramine POWD Take 2 g by mouth 2 (two) times daily.     cloZAPine (CLOZARIL) 100 MG tablet Take 100 mg by mouth at bedtime.     clozapine (CLOZARIL) 50 MG tablet Take 50 mg by mouth in the morning.     divalproex (DEPAKOTE SPRINKLE) 125 MG capsule Take 500 mg by mouth 2 (two) times daily.     docusate sodium (COLACE) 100 MG capsule Take 100 mg by mouth daily.     folic acid (FOLVITE) 1 MG tablet  Take 1 mg by mouth daily.     LORazepam (ATIVAN) 0.5 MG tablet Take 0.5 mg by mouth every 8 (eight) hours as needed for anxiety.     Multiple Vitamin (MULTIVITAMIN) tablet Take 1 tablet by mouth daily.     OLANZapine zydis (ZYPREXA) 10 MG disintegrating tablet Take 10 mg by mouth at bedtime.     omeprazole (PRILOSEC) 20 MG capsule Take 20 mg by mouth daily.     sertraline (ZOLOFT) 25 MG tablet Take 25 mg by mouth in the morning.     thiamine 100 MG tablet Take 100 mg by mouth daily.      Musculoskeletal: Strength & Muscle Tone: within normal limits Gait & Station: normal Patient leans: Front   Psychiatric Specialty Exam: Presentation  General Appearance: Casual Eye Contact:Poor Speech:Pressured Speech Volume:Increased (Minimal) Handedness:Right  Mood and Affect  Mood:Angry; Irritable; Labile Affect:Congruent; Labile  Thought Process  Thought Processes:Disorganized; Irrevelant Descriptions of Associations:Tangential  Orientation:Partial  Thought Content:Illogical  History of  Schizophrenia/Schizoaffective disorder:Yes  Duration of Psychotic Symptoms:Greater than six months  Hallucinations:Hallucinations: None  Ideas of Reference:None  Suicidal Thoughts:Suicidal Thoughts: No  Homicidal Thoughts:Homicidal Thoughts: Yes, Passive (want to kill her daughter)   Sensorium  Memory:No data recorded Judgment:Poor Insight:Poor  Executive Functions  Concentration:Poor Attention Span:Poor Recall:Poor Progress Energy of Knowledge:Poor Language:Poor  Psychomotor Activity  Psychomotor Activity:Psychomotor Activity: Normal  Assets  Assets:Housing; Social Support   Sleep  Sleep:Sleep: Poor  Physical Exam:  Unable to assess at this time due to patient unable to participate or answer questions. Physical Exam ROS Blood pressure 129/76, pulse 86, temperature 99.2 F (37.3 C), temperature source Oral, resp. rate 18, height 5\' 5"  (1.651 m), weight 69.4 kg, SpO2 96 %. Body mass index is 25.46 kg/m.  Medical Decision Making: Patient meets criteria for inpatient hospitalization.   Long hx of Mental illness compounded with UTI. She is a danger to self and others.   We will obtain lab work and resume home medications.   Problem 1: Schizoaffective disorder, Bipolar type Manic with Psychotic symptoms  Problem 2: Dementia  Disposition:  Admit, seek placement  , NP-PMHNP-BC 01/24/2022 3:57 PM

## 2022-01-24 NOTE — ED Provider Notes (Signed)
[facility]-EMERGENCY DEPT Provider Note   CSN: [id number] Arrival date & time: 01/24/22  1217     History  Chief Complaint  Patient presents with   Mental Health Problem   Medical Clearance    Angella Montas is a 71 y.o. female.  The history is provided by the patient, medical records and the EMS personnel. No language interpreter was used.  Mental Health Problem   71 year old female significant history of schizoaffective disorder, COPD, GERD, A-fib [facility] for concerns of homicidal ideation.  Patient reports she is fearful that "those people are going to kill my family" and she is unable to tell me who others people.  She does have thought about harming them to protect her family.  She denies any suicidal ideation.  She does admits to auditory and visual hallucination.  Unsure how long this has been an ongoing issue.  She states she is sleeping eating fine.  She states she is taking most of her medication.  Patient sent here for medical clearance and for psych eval.  Home Medications Prior to Admission medications   Not on File      Allergies    Patient has no known allergies.    Review of Systems   Review of Systems  All other systems reviewed and are negative.   Physical Exam Updated Vital Signs BP 91/70   Pulse 92   Temp 99.2 F (37.3 C) (Oral)   Resp 18   Ht 5\' 5"  (1.651 m)   Wt 69.4 kg   SpO2 96%   BMI 25.46 kg/m  Physical Exam Vitals and nursing note reviewed.  Constitutional:      General: She is not in acute distress.    Appearance: She is well-developed.  HENT:     Head: Atraumatic.  Eyes:     Conjunctiva/sclera: Conjunctivae normal.  Pulmonary:     Effort: Pulmonary effort is normal.  Musculoskeletal:     Cervical back: Neck supple.  Skin:    Findings: No rash.  Neurological:     Mental Status: She is alert and oriented to person, place, and time.     GCS:  GCS eye subscore is 4. GCS verbal subscore is 5. GCS motor subscore is 6.  Psychiatric:        Mood and Affect: Mood normal.        Speech: Speech normal.        Behavior: Behavior is cooperative.        Thought Content: Thought content is paranoid. Thought content includes homicidal ideation. Thought content does not include suicidal ideation.     ED Results / Procedures / Treatments   Labs (all labs ordered are listed, but only abnormal results are displayed) Labs Reviewed  URINALYSIS, ROUTINE W REFLEX MICROSCOPIC - Abnormal; Notable for the following components:      Result Value   APPearance CLOUDY (*)    Hgb urine dipstick SMALL (*)    Nitrite POSITIVE (*)    Leukocytes,Ua LARGE (*)    WBC, UA >50 (*)    Bacteria, UA MANY (*)    Crystals PRESENT (*)    All other components within normal limits  CBC WITH DIFFERENTIAL/PLATELET - Abnormal; Notable for the following components:   MCV 103.2 (*)    All other components within normal limits  RESP PANEL BY RT-PCR (FLU A&B, COVID) ARPGX2  URINE CULTURE  ETHANOL  RAPID URINE DRUG  SCREEN, HOSP PERFORMED  COMPREHENSIVE METABOLIC PANEL  VALPROIC ACID LEVEL  VITAMIN B12  CLOZAPINE (CLOZARIL)  FOLATE    EKG None ED ECG REPORT   Date: 01/24/2022  Rate: 84  Rhythm: normal sinus rhythm and sinus arrhythmia  QRS Axis: left  Intervals: normal  ST/T Wave abnormalities: nonspecific ST changes  Conduction Disutrbances:left anterior fascicular block  Narrative Interpretation:   Old EKG Reviewed: unchanged  I have personally reviewed the EKG tracing and agree with the computerized printout as noted.   Radiology No results found.  Procedures Procedures    Medications Ordered in ED Medications  risperiDONE (RISPERDAL M-TABS) disintegrating tablet 2 mg (has no administration in time range)    And  LORazepam (ATIVAN) tablet 1 mg (has no administration in time range)    And  ziprasidone (GEODON) injection 20 mg (has no  administration in time range)  acetaminophen (TYLENOL) tablet 650 mg (has no administration in time range)  zolpidem (AMBIEN) tablet 5 mg (has no administration in time range)  ondansetron (ZOFRAN) tablet 4 mg (has no administration in time range)  alum & mag hydroxide-simeth (MAALOX/MYLANTA) 200-200-20 MG/5ML suspension 30 mL (has no administration in time range)  cloZAPine (CLOZARIL) tablet 100 mg (has no administration in time range)  divalproex (DEPAKOTE SPRINKLE) capsule 500 mg (500 mg Oral Given 01/24/22 1442)  OLANZapine zydis (ZYPREXA) disintegrating tablet 10 mg (has no administration in time range)  thiamine tablet 100 mg (100 mg Oral Given 01/24/22 1443)  benztropine (COGENTIN) tablet 1 mg (has no administration in time range)  cloZAPine (CLOZARIL) tablet 50 mg (has no administration in time range)  cephALEXin (KEFLEX) capsule 500 mg (has no administration in time range)     ED Course/ Medical Decision Making/ A&P                           Medical Decision Making Amount and/or Complexity of Data Reviewed Labs: ordered.  Risk OTC drugs. Prescription drug management.   BP 91/70   Pulse 92   Temp 99.2 F (37.3 C) (Oral)   Resp 18   Ht 5\' 5"  (1.651 m)   Wt 69.4 kg   SpO2 96%   BMI 25.46 kg/m   1:01 PM This is a 71 year old female with significant history of schizoaffective disorder sent here with concerns of homicidal ideation.  Patient does admits that she is having auditory with hallucination.  She also reports people are out to harm her family and she will protect her family.  Does not endorse any suicidal ideation.  She did not complain of any active pain.  She is calm and cooperative at this time.  Patient was given as needed 0.5 mg of Ativan prior to arrival to the ED from the staff.  Patient is alert and oriented x3.  4:13 PM Medical work-up is remarkable for a urine that shows finding concerning for urinary tract infection as patient is nitrite positive with  large leukocyte Estrace and many bacteria.  I suspect her urinalysis may contribute to some aspects of delirium and increasing confusion but in the setting of having history of schizoaffective sorter and having hallucination and homicidal ideation, she will need to be further evaluated by psychiatry team.  Urine culture sent, I have ordered Keflex antibiotic as treatment but otherwise patient is medically cleared.  This patient presents to the ED for concern of homicidal ideation, this involves an extensive number of treatment options, and is a complaint that  carries with it a high risk of complications and morbidity.  The differential diagnosis includes psychosis, delirium, depression  Co morbidities that complicate the patient evaluation schizoaffective disorder Additional history obtained:  Additional history obtained from nursing notes External records from outside source obtained and reviewed including prior visits  Lab Tests:  I Ordered, and personally interpreted labs.  The pertinent results include:  as above   Cardiac Monitoring:  The patient was maintained on a cardiac monitor.  I personally viewed and interpreted the cardiac monitored which showed an underlying rhythm of: NSR  Medicines ordered and prescription drug management:  I ordered medication including keflex  for uti Reevaluation of the patient after these medicines showed that the patient stayed the same I have reviewed the patients home medicines and have made adjustments as needed  Test Considered: as above  Critical Interventions: abx  Medical clearance   Problem List / ED Course: homicidal ideation    Reevaluation:  After the interventions noted above, I reevaluated the patient and found that they have :stayed the same  Social Determinants of Health:   Dispostion:  After consideration of the diagnostic results and the patients response to treatment, I feel that the patent would benefit from psych  admission.         Final Clinical Impression(s) / ED Diagnoses Final diagnoses:  Homicidal ideation  Acute lower UTI  Schizoaffective disorder, unspecified type [facility])    Rx / DC Orders ED Discharge Orders     None         Fayrene Helper, PA-C 01/24/22 1841    Benjiman Core, MD 01/25/22 [phone]

## 2022-01-24 NOTE — ED Notes (Signed)
Pt changed into scrubs. Pt wanded. 1 pt bag in designated cabinet

## 2022-01-25 DIAGNOSIS — F250 Schizoaffective disorder, bipolar type: Secondary | ICD-10-CM | POA: Diagnosis not present

## 2022-01-25 LAB — COMPREHENSIVE METABOLIC PANEL
ALT: 13 U/L (ref 0–44)
AST: 16 U/L (ref 15–41)
Albumin: 3.7 g/dL (ref 3.5–5.0)
Alkaline Phosphatase: 56 U/L (ref 38–126)
Anion gap: 6 (ref 5–15)
BUN: 23 mg/dL (ref 8–23)
CO2: 24 mmol/L (ref 22–32)
Calcium: 9.6 mg/dL (ref 8.9–10.3)
Chloride: 111 mmol/L (ref 98–111)
Creatinine, Ser: 1.18 mg/dL — ABNORMAL HIGH (ref 0.44–1.00)
GFR, Estimated: 49 mL/min — ABNORMAL LOW (ref >60)
Glucose, Bld: 108 mg/dL — ABNORMAL HIGH (ref 70–99)
Potassium: 4.2 mmol/L (ref 3.5–5.1)
Sodium: 141 mmol/L (ref 135–145)
Total Bilirubin: 0.8 mg/dL (ref 0.3–1.2)
Total Protein: 7 g/dL (ref 6.5–8.1)

## 2022-01-25 LAB — VALPROIC ACID LEVEL: Valproic Acid Lvl: 90 ug/mL (ref 50.0–100.0)

## 2022-01-25 LAB — VITAMIN B12: Vitamin B-12: 316 pg/mL (ref 180–914)

## 2022-01-25 LAB — FOLATE: Folate: 47.8 ng/mL (ref >5.9)

## 2022-01-25 NOTE — ED Notes (Addendum)
Patient was alert and cooperative throughout shift. She started out saying that she was not going to take her medications d/t being afraid we were trying to kill her. I reassured her that we were her to keep her safe and help her. He finally took her medications with apple sauce one to two at a time.   She is very hard stick so I had to ask Deandra, phlebotomist in ED, to stick her for 2 gold and 2 red tubes. Sent to lab immediately.

## 2022-01-25 NOTE — ED Provider Notes (Signed)
Emergency Medicine Observation Re-evaluation Note  Onia Shiflett is a 71 y.o. female, seen on rounds today.  Pt initially presented to the ED for complaints of Mental Health Problem and Medical Clearance Currently, the patient is paranoid.  Physical Exam  BP 128/78 (BP Location: Left Arm)   Pulse 75   Temp 97.8 F (36.6 C) (Oral)   Resp 18   Ht 1.651 m (5\' 5" )   Wt 69.4 kg   SpO2 98%   BMI 25.46 kg/m  Physical Exam General: elderly Cardiac: normal Lungs: sats and rr normal Psych: paranoid  ED Course / MDM  EKG:   I have reviewed the labs performed to date as well as medications administered while in observation.  Recent changes in the last 24 hours include bh evaluation.  Plan  Current plan is for advise ip.  Damita Summerfield is not under involuntary commitment.     , MD 01/25/22 1105

## 2022-01-25 NOTE — Consult Note (Signed)
Telepsych Consultation   Reason for Consult: Psychiatry provider assessment Referring Physician: Dr. Rosalia Hammersay Location of Patient: [name] emergency department Location of Provider: Behavioral Health TTS Department  Patient Identification: Deanna Rhodes MRN:  [id number] Principal Diagnosis: Schizoaffective disorder, bipolar type [facility](HCC) Diagnosis:  Principal Problem:   Schizoaffective disorder, bipolar type (HCC)   Total Time spent with patient: 30 minutes  Subjective:   Deanna Rhodes is a [age] y.o. female patient.  Patient states "everybody is fine because the Shaune PollackLord has saved me, I am Jesus Christ and God."  HPI: Deanna Rhodes presents with tangential conversation and apparent paranoid ideations.  She states "I hurt my family but I did not mean to, please do not hurt my family."  Patient is reassessed, via telepsychiatry monitor, by nurse practitioner.  She is reclined on stretcher in the emergency department, no apparent distress.  She is alert to self at this time.  She is minimally forthcoming during assessment, appears guarded.  She presents with anxious mood and labile and tearful affect.   Deanna Rhodes has been diagnosed with schizoaffective disorder, bipolar type.  She presents with disorganized conversation.  Reports "they are trying to get me to hurt the president, they talk bad about the psychiatrist, I do not have 1 and I do not need one, but maybe I do."  Patient states "I do not want to talk anymore."  Patient endorses average sleep and decreased appetite.  Conversation tangential and nature and patient is hyperreligious.  Patient states "I am not hungry, Jesus fasted and I can too."  No suicidal or homicidal ideation reported.  Patient offered support and encouragement.  Past Psychiatric History: Schizoaffective disorder, bipolar type  Risk to Self:   Risk to Others:   Prior Inpatient Therapy:   Prior Outpatient Therapy:    Past Medical History:  Past Medical History:   Diagnosis Date   A-fib (HCC)    COPD (chronic obstructive pulmonary disease) (HCC)    GERD (gastroesophageal reflux disease)    Neutropenia (HCC)    Schizoaffective disorder (HCC)    History reviewed. No pertinent surgical history. Family History: History reviewed. No pertinent family history. Family Psychiatric  History: none reported Social History:  Social History   Substance and Sexual Activity  Alcohol Use None     Social History   Substance and Sexual Activity  Drug Use Not on file    Social History   Socioeconomic History   Marital status: Divorced    Spouse name: Not on file   Number of children: Not on file   Years of education: Not on file   Highest education level: Not on file  Occupational History   Not on file  Tobacco Use   Smoking status: Unknown   Smokeless tobacco: Not on file  Substance and Sexual Activity   Alcohol use: Not on file   Drug use: Not on file   Sexual activity: Not on file  Other Topics Concern   Not on file  Social History Narrative   Not on file   Social Determinants of Health   Financial Resource Strain: Not on file  Food Insecurity: Not on file  Transportation Needs: Not on file  Physical Activity: Not on file  Stress: Not on file  Social Connections: Not on file   Additional Social History:    Allergies:   Allergies  Allergen Reactions   Codeine Other (See Comments)    Listed allergy on MAR Unknown reaction   Imdur [Isosorbide Nitrate] Other (See Comments)  Listed allergy on MAR Unknown reaction    Labs:  Results for orders placed or performed during the hospital encounter of 01/24/22 (from the past 48 hour(s))  Ethanol     Status: None   Collection Time: 01/24/22 12:52 PM  Result Value Ref Range   Alcohol, Ethyl (B) <10 <10 mg/dL    Comment: (NOTE) Lowest detectable limit for serum alcohol is 10 mg/dL.  For medical purposes only. Performed at [facility], [address], [place], [place] [address]   Urine rapid drug screen (hosp performed)     Status: None   Collection Time: 01/24/22 12:52 PM  Result Value Ref Range   Opiates NONE DETECTED NONE DETECTED   Cocaine NONE DETECTED NONE DETECTED   Benzodiazepines NONE DETECTED NONE DETECTED   Amphetamines NONE DETECTED NONE DETECTED   Tetrahydrocannabinol NONE DETECTED NONE DETECTED   Barbiturates NONE DETECTED NONE DETECTED    Comment: (NOTE) DRUG SCREEN FOR MEDICAL PURPOSES ONLY.  IF CONFIRMATION IS NEEDED FOR ANY PURPOSE, NOTIFY LAB WITHIN 5 DAYS.  LOWEST DETECTABLE LIMITS FOR URINE DRUG SCREEN Drug Class                     Cutoff (ng/mL) Amphetamine and metabolites    1000 Barbiturate and metabolites    200 Benzodiazepine                 200 Tricyclics and metabolites     300 Opiates and metabolites        300 Cocaine and metabolites        300 THC                            50 Performed at [facility], [address] [place], [place] [address]   CBC with Diff     Status: Abnormal   Collection Time: 01/24/22 12:52 PM  Result Value Ref Range   WBC 6.1 4.0 - 10.5 K/uL   RBC 4.01 3.87 - 5.11 MIL/uL   Hemoglobin 13.1 12.0 - 15.0 g/dL   HCT 17.4 94.4 - 96.7 %   MCV 103.2 (H) 80.0 - 100.0 fL   MCH 32.7 26.0 - 34.0 pg   MCHC 31.6 30.0 - 36.0 g/dL   RDW 59.1 63.8 - 46.6 %   Platelets 185 150 - 400 K/uL   nRBC 0.0 0.0 - 0.2 %   Neutrophils Relative % 53 %   Neutro Abs 3.2 1.7 - 7.7 K/uL   Lymphocytes Relative 34 %   Lymphs Abs 2.1 0.7 - 4.0 K/uL   Monocytes Relative 11 %   Monocytes Absolute 0.7 0.1 - 1.0 K/uL   Eosinophils Relative 1 %   Eosinophils Absolute 0.1 0.0 - 0.5 K/uL   Basophils Relative 1 %   Basophils Absolute 0.1 0.0 - 0.1 K/uL   Immature Granulocytes 0 %   Abs Immature Granulocytes 0.01 0.00 - 0.07 K/uL    Comment: Performed at [facility], [address] [place], [place] [address]  Urinalysis, Routine w reflex microscopic Urine,  Clean Catch     Status: Abnormal   Collection Time: 01/24/22 12:53 PM  Result Value Ref Range   Color, Urine YELLOW YELLOW   APPearance CLOUDY (A) CLEAR   Specific Gravity, Urine 1.006 1.005 - 1.030   pH 6.0 5.0 - 8.0   Glucose, UA NEGATIVE NEGATIVE mg/dL   Hgb urine dipstick SMALL (A)  NEGATIVE   Bilirubin Urine NEGATIVE NEGATIVE   Ketones, ur NEGATIVE NEGATIVE mg/dL   Protein, ur NEGATIVE NEGATIVE mg/dL   Nitrite POSITIVE (A) NEGATIVE   Leukocytes,Ua LARGE (A) NEGATIVE   RBC / HPF 11-20 0 - 5 RBC/hpf   WBC, UA >50 (H) 0 - 5 WBC/hpf   Bacteria, UA MANY (A) NONE SEEN   Squamous Epithelial / LPF 0-5 0 - 5   WBC Clumps PRESENT    Mucus PRESENT    Budding Yeast PRESENT    Crystals PRESENT (A) NEGATIVE    Comment: Performed at [facility], [address] [place], [place] [address]  Resp Panel by RT-PCR (Flu A&B, Covid) Anterior Nasal Swab     Status: None   Collection Time: 01/24/22  1:06 PM   Specimen: Anterior Nasal Swab  Result Value Ref Range   SARS Coronavirus 2 by RT PCR NEGATIVE NEGATIVE    Comment: (NOTE) SARS-CoV-2 target nucleic acids are NOT DETECTED.  The SARS-CoV-2 RNA is generally detectable in upper respiratory specimens during the acute phase of infection. The lowest concentration of SARS-CoV-2 viral copies this assay can detect is 138 copies/mL. A negative result does not preclude SARS-Cov-2 infection and should not be used as the sole basis for treatment or other patient management decisions. A negative result may occur with  improper specimen collection/handling, submission of specimen other than nasopharyngeal swab, presence of viral mutation(s) within the areas targeted by this assay, and inadequate number of viral copies(<138 copies/mL). A negative result must be combined with clinical observations, patient history, and epidemiological information. The expected result is Negative.  Fact Sheet for Patients:   BloggerCourse.com  Fact Sheet for Healthcare Providers:  SeriousBroker.it  This test is no t yet approved or cleared by the [place] FDA and  has been authorized for detection and/or diagnosis of SARS-CoV-2 by FDA under an Emergency Use Authorization (EUA). This EUA will remain  in effect (meaning this test can be used) for the duration of the COVID-19 declaration under Section 564(b)(1) of the Act, 21 U.S.C.section 360bbb-3(b)(1), unless the authorization is terminated  or revoked sooner.       Influenza A by PCR NEGATIVE NEGATIVE   Influenza B by PCR NEGATIVE NEGATIVE    Comment: (NOTE) The Xpert Xpress SARS-CoV-2/FLU/RSV plus assay is intended as an aid in the diagnosis of influenza from Nasopharyngeal swab specimens and should not be used as a sole basis for treatment. Nasal washings and aspirates are unacceptable for Xpert Xpress SARS-CoV-2/FLU/RSV testing.  Fact Sheet for Patients: BloggerCourse.com  Fact Sheet for Healthcare Providers: SeriousBroker.it  This test is not yet approved or cleared by the [place] FDA and has been authorized for detection and/or diagnosis of SARS-CoV-2 by FDA under an Emergency Use Authorization (EUA). This EUA will remain in effect (meaning this test can be used) for the duration of the COVID-19 declaration under Section 564(b)(1) of the Act, 21 U.S.C. section 360bbb-3(b)(1), unless the authorization is terminated or revoked.  Performed at [facility], [address] [place], [place] [address]   Valproic acid level     Status: None   Collection Time: 01/25/22  6:37 AM  Result Value Ref Range   Valproic Acid Lvl 90 50.0 - 100.0 ug/mL    Comment: Performed at [facility], [address], [place], [place] [address]  Vitamin B12     Status: None   Collection Time: 01/25/22  6:37 AM   Result Value  Ref Range   Vitamin B-12 316 180 - 914 pg/mL    Comment: (NOTE) This assay is not validated for testing neonatal or myeloproliferative syndrome specimens for Vitamin B12 levels. Performed at [facility], [address], [place], [place] [address]   Folate     Status: None   Collection Time: 01/25/22  6:37 AM  Result Value Ref Range   Folate 47.8 >5.9 ng/mL    Comment: RESULTS CONFIRMED BY MANUAL DILUTION Performed at [facility], [address] [place], [place] [address]     Medications:  Current Facility-Administered Medications  Medication Dose Route Frequency Provider Last Rate Last Admin   acetaminophen (TYLENOL) tablet 650 mg  650 mg Oral Q4H PRN Fayrene Helper, PA-C       alum & mag hydroxide-simeth (MAALOX/MYLANTA) 200-200-20 MG/5ML suspension 30 mL  30 mL Oral Q6H PRN Fayrene Helper, PA-C       benztropine (COGENTIN) tablet 1 mg  1 mg Oral BID Benjiman Core, MD   1 mg at 01/25/22 4818   cephALEXin (KEFLEX) capsule 500 mg  500 mg Oral Q8H Fayrene Helper, PA-C   500 mg at 01/25/22 0630   cloZAPine (CLOZARIL) tablet 100 mg  100 mg Oral QHS Fayrene Helper, PA-C   100 mg at 01/24/22 2121   cloZAPine (CLOZARIL) tablet 50 mg  50 mg Oral Daily Fayrene Helper, PA-C   50 mg at 01/25/22 5631   divalproex (DEPAKOTE SPRINKLE) capsule 500 mg  500 mg Oral BID Fayrene Helper, PA-C   500 mg at 01/25/22 4970   risperiDONE (RISPERDAL M-TABS) disintegrating tablet 2 mg  2 mg Oral Q8H PRN Fayrene Helper, PA-C   2 mg at 01/24/22 1636   And   LORazepam (ATIVAN) tablet 1 mg  1 mg Oral PRN Fayrene Helper, PA-C       And   ziprasidone (GEODON) injection 20 mg  20 mg Intramuscular PRN Fayrene Helper, PA-C       OLANZapine zydis (ZYPREXA) disintegrating tablet 10 mg  10 mg Oral QHS Fayrene Helper, PA-C   10 mg at 01/24/22 2122   ondansetron (ZOFRAN) tablet 4 mg  4 mg Oral Q8H PRN Fayrene Helper, PA-C       thiamine tablet 100 mg  100 mg Oral Daily Fayrene Helper, PA-C   100 mg  at 01/25/22 2637   zolpidem (AMBIEN) tablet 5 mg  5 mg Oral QHS PRN Fayrene Helper, PA-C       Current Outpatient Medications  Medication Sig Dispense Refill   acetaminophen (TYLENOL) 500 MG tablet Take 500 mg by mouth 4 (four) times daily.     benztropine (COGENTIN) 1 MG tablet Take 1 mg by mouth 2 (two) times daily.     Cholestyramine POWD Take 2 g by mouth 2 (two) times daily.     cloZAPine (CLOZARIL) 100 MG tablet Take 100 mg by mouth at bedtime.     clozapine (CLOZARIL) 50 MG tablet Take 50 mg by mouth in the morning.     divalproex (DEPAKOTE SPRINKLE) 125 MG capsule Take 500 mg by mouth 2 (two) times daily.     docusate sodium (COLACE) 100 MG capsule Take 100 mg by mouth daily.     folic acid (FOLVITE) 1 MG tablet Take 1 mg by mouth daily.     LORazepam (ATIVAN) 0.5 MG tablet Take 0.5 mg by mouth every 8 (eight) hours as needed for anxiety.     Multiple Vitamin (MULTIVITAMIN) tablet Take 1  tablet by mouth daily.     OLANZapine zydis (ZYPREXA) 10 MG disintegrating tablet Take 10 mg by mouth at bedtime.     omeprazole (PRILOSEC) 20 MG capsule Take 20 mg by mouth daily.     sertraline (ZOLOFT) 25 MG tablet Take 25 mg by mouth in the morning.     thiamine 100 MG tablet Take 100 mg by mouth daily.      Musculoskeletal: Strength & Muscle Tone: decreased Gait & Station:  unable to assess Patient leans:  unable to assess          Psychiatric Specialty Exam:  Presentation  General Appearance: Casual  Eye Contact:Poor  Speech:Pressured  Speech Volume:Increased (Minimal)  Handedness:Right   Mood and Affect  Mood:Angry; Irritable; Labile  Affect:Congruent; Labile   Thought Process  Thought Processes:Disorganized; Irrevelant  Descriptions of Associations:Tangential  Orientation:Partial  Thought Content:Illogical  History of Schizophrenia/Schizoaffective disorder:Yes  Duration of Psychotic Symptoms:Greater than six months  Hallucinations:Hallucinations:  None  Ideas of Reference:None  Suicidal Thoughts:Suicidal Thoughts: No  Homicidal Thoughts:Homicidal Thoughts: Yes, Passive (want to kill her daughter)   Sensorium  Memory:No data recorded Judgment:Poor  Insight:Poor   Executive Functions  Concentration:Poor  Attention Span:Poor  Recall:Poor  Progress Energy of Knowledge:Poor  Language:Poor   Psychomotor Activity  Psychomotor Activity:Psychomotor Activity: Normal   Assets  Assets:Housing; Social Support   Sleep  Sleep:Sleep: Poor    Physical Exam: Physical Exam Vitals and nursing note reviewed.  Constitutional:      Appearance: Normal appearance.  HENT:     Head: Normocephalic and atraumatic.     Nose: Nose normal.  Cardiovascular:     Rate and Rhythm: Normal rate.  Pulmonary:     Effort: Pulmonary effort is normal.  Musculoskeletal:        General: Normal range of motion.     Cervical back: Normal range of motion.  Neurological:     Mental Status: She is alert.     Comments: Oriented to self, partially oriented to situation    Review of Systems  Constitutional: Negative.   HENT: Negative.    Eyes: Negative.   Respiratory: Negative.    Cardiovascular: Negative.   Gastrointestinal: Negative.   Skin: Negative.   Psychiatric/Behavioral:  The patient is nervous/anxious.    Blood pressure 128/78, pulse 75, temperature 97.8 F (36.6 C), temperature source Oral, resp. rate 18, height 5\' 5"  (1.651 m), weight 69.4 kg, SpO2 98 %. Body mass index is 25.46 kg/m.  Treatment Plan Summary: Daily contact with patient to assess and evaluate symptoms and progress in treatment Patient reviewed with Dr .  CMP ordered. Valproic acid level measures 90.  Disposition: Recommend psychiatric Inpatient admission when medically cleared. Supportive therapy provided about ongoing stressors. Patient will likely require involuntary commitment petition for admission to inpatient geriatric psychiatric facility for  treatment and stabilization.  Attending provider aware.  This service was provided via telemedicine using a 2-way, interactive audio and video technology.  Names of all persons participating in this telemedicine service and their role in this encounter. Name: Verginia Toohey Role: Patient  Name: Dr Deanna Aldo Role: Attending physician  Name: Margarita Grizzle  Role: NP  Name: Dr Doran Heater Role: Psychiatry physician    Bronwen Betters, FNP 01/25/2022 10:02 AM

## 2022-01-25 NOTE — BH Assessment (Addendum)
BHH Assessment Progress Note   Per Doran Heater, NP, this pt continues to require psychiatric hospitalization at this time.  Inetta Fermo finds that pt meets criteria for IVC if necessary.  The following facilities have been contacted to seek placement for this pt, with results as noted:  Beds available, information sent, decision pending:  [facility]  Unable to reach: Mikey Bussing (left message at 13:09)  Declined: [facility] ([place], [place], [place]) - staff at [place] report that if I didn't receive a call back it means that she has been declined. [facility] - dementia is exclusionary  At capacity: [facility]  If this voluntary pt is accepted to a facility, please discuss disposition with pt and/or daughter to be sure that they agree to the plan.  If a facility agrees to accept pt and the plan changes in any way please call the facility to inform them of the change.  Final disposition is pending as of this writing.  Doylene Canning, [place] Behavioral [facility] Coordinator [phone]

## 2022-01-25 NOTE — ED Notes (Signed)
Patient went to [facility] and didn't quite make it and urinated a little on her pants and shirt. She requested a depends. We placed a brief and clean scrubs on patient.

## 2022-01-25 NOTE — ED Notes (Signed)
Patient coming out of room.  Needs redirection.   Per patient, "I am paranoid".  Patient irritable.

## 2022-01-25 NOTE — ED Notes (Addendum)
Misty Stanley, Clinical Lead RN from [facility], called to ask about this patient and requested IVC paperwork. Faxed IVC paperwork and she called to confirm they received it. She stated that the physician will take a look at whether they will accept patient tomorrow morning.

## 2022-01-26 DIAGNOSIS — F250 Schizoaffective disorder, bipolar type: Secondary | ICD-10-CM | POA: Diagnosis not present

## 2022-01-26 NOTE — ED Notes (Signed)
Report called to Sedalia Muta, Charity fundraiser at [facility].

## 2022-01-26 NOTE — ED Notes (Addendum)
Therapist at bedside speaking with patient. Patient can be transferred after 6 pm this evening.

## 2022-01-26 NOTE — ED Notes (Signed)
Patient has been resting. Got up and came out of room, requesting to use phone at this time. Sitter informed patient she is allowed to use phone at 10am. Patient agreed and went back into room and laid back down.

## 2022-01-26 NOTE — ED Provider Notes (Signed)
Emergency Medicine Observation Re-evaluation Note  Deanna Rhodes is a 71 y.o. female, seen on rounds today.  Pt initially presented to the ED for complaints of Mental Health Problem and Medical Clearance Currently, the patient is sleeping, no distress.  Physical Exam  BP 130/80 (BP Location: Right Arm)   Pulse 93   Temp (!) 93 F (33.9 C) (Axillary)   Resp 20   Ht 5\' 5"  (1.651 m)   Wt 69.4 kg   SpO2 96%   BMI 25.46 kg/m  Physical Exam General: Sleeping Cardiac: Well-perfused Lungs: No respiratory distress Psych: Calm  ED Course / MDM  EKG:EKG Interpretation  Date/Time:  Tuesday January 24 2022 13:20:15 EDT Ventricular Rate:  84 PR Interval:  158 QRS Duration: 88 QT Interval:  366 QTC Calculation: 432 R Axis:   -64 Text Interpretation: Normal sinus rhythm with sinus arrhythmia Left anterior fascicular block Moderate voltage criteria for LVH, may be normal variant ( R in aVL , Cornell product ) Septal infarct , age undetermined Abnormal ECG When compared with ECG of 15-Dec-2021 04:44, No significant change since last tracing Confirmed by 17-Dec-2021 [phone]) on 01/25/2022 5:03:20 PM  I have reviewed the labs performed to date as well as medications administered while in observation.  Recent changes in the last 24 hours include awaiting psychiatric placement.  Plan  Current plan is for psychiatric placement. Deanna Rhodes is under involuntary commitment.      Wynona Canes, MD 01/26/22 [phone]

## 2022-01-26 NOTE — ED Notes (Signed)
Called and left voice message at [facility] for patient transport to [facility].

## 2022-01-26 NOTE — ED Notes (Signed)
Patient is alert and cooperative. She is intermittently very paranoid. She will take all medications ordered if you take time with her and assure her that we are here to keep her safe and not to harm her. She likes to take her medications whole in apple sauce.

## 2022-01-26 NOTE — ED Notes (Signed)
Patient sleeping at this time. Will do VSs after she wakes up.

## 2022-01-26 NOTE — ED Provider Notes (Signed)
Emergency Medicine Observation Re-evaluation Note  Deanna Rhodes is a 71 y.o. female, seen on rounds today.  Pt initially presented to the ED for complaints of Mental Health Problem and Medical Clearance Currently, the patient is accepted at [facility] for admission.  Physical Exam  BP 132/73 (BP Location: Left Arm)   Pulse 84   Temp (!) 97.4 F (36.3 C) (Oral)   Resp 18   Ht 5\' 5"  (1.651 m)   Wt 69.4 kg   SpO2 98%   BMI 25.46 kg/m  Physical Exam General: Alert and ambulatory. Lungs: Respiration are spontaneous and nonlabored. Psych: Patient is alert and having constant conversation.  ED Course / MDM  EKG:EKG Interpretation  Date/Time:  Tuesday January 24 2022 13:20:15 EDT Ventricular Rate:  84 PR Interval:  158 QRS Duration: 88 QT Interval:  366 QTC Calculation: 432 R Axis:   -64 Text Interpretation: Normal sinus rhythm with sinus arrhythmia Left anterior fascicular block Moderate voltage criteria for LVH, may be normal variant ( R in aVL , Cornell product ) Septal infarct , age undetermined Abnormal ECG When compared with ECG of 15-Dec-2021 04:44, No significant change since last tracing Confirmed by 17-Dec-2021 [phone]) on 01/25/2022 5:03:20 PM  I have reviewed the labs performed to date as well as medications administered while in observation.  Recent changes in the last 24 hours include none.  Plan  Current plan is for transport to [facility] with accepting physician [facility]. Deanna Rhodes is under involuntary commitment. EMTALA completed.  At time of transport patient is alert and independently ambulatory with coordinated fashion.  She does have ongoing tangential speech and is mildly hostile but in no acute distress.  Patient is cooperative.      Deanna Canes, MD 01/26/22 1924

## 2022-01-26 NOTE — BH Assessment (Signed)
BHH Assessment Progress Note   Per Doran Heater, NP, this pt requires psychiatric hospitalization at this time.  Pt is now under IVC initiated by EDP Tanda Rockers, DO.  At 10:18 Morrie Sheldon calls from [facility] to report that pt has been accepted to their facility by Dr Althea Grimmer.  They will be ready to receive pt after 18:00.  Dahlia Byes, NP concurs with this decision.  EDP Glynn Octave, MD and pt's nurse, Oneta Rack, have been notified, and Oneta Rack agrees to call report to [phone].  Pt is to be transported via [facility].  Pt's daughter, Pamella Pert [phone]), has indicated that she is pt's legal guardian, however, she has not provided a letter of guardianship.  However, pt signed Consent to Release information to her.  At 11:35 I called her, notifying her of disposition and asking for a copy of the letter of guardianship, which she agrees to send.  Receipt is pending as of this writing.  Please note that pt is unlikely to be transported by the sheriff until tomorrow morning.  Because her Respiratory Panel will be approaching 72 hour from result time by then, Julieanne Cotton has ordered a repeat.  Doylene Canning Behavioral Health Coordinator [phone]

## 2022-01-26 NOTE — ED Notes (Signed)
Sheriff's Officers arrived to transport patient.

## 2022-01-26 NOTE — ED Notes (Signed)
Dinner arrived. 

## 2022-01-27 LAB — URINE CULTURE: Culture: >=100000 — AB

## 2022-01-27 LAB — CLOZAPINE (CLOZARIL)
Clozapine Lvl: 154 ng/mL — ABNORMAL LOW (ref 350–600)
NorClozapine: 41 ng/mL
Total(Cloz+Norcloz): 195 ng/mL

## 2022-01-28 ENCOUNTER — Telehealth (HOSPITAL_BASED_OUTPATIENT_CLINIC_OR_DEPARTMENT_OTHER): Payer: Self-pay | Admitting: *Deleted

## 2022-01-28 NOTE — Progress Notes (Signed)
ED Antimicrobial Stewardship Positive Culture Follow Up   Deanna Rhodes is an 71 y.o. female who presented to [facility] on 01/24/2022 with a chief complaint of  Chief Complaint  Patient presents with   Mental Health Problem   Medical Clearance    Recent Results (from the past 720 hour(s))  Urine Culture     Status: Abnormal   Collection Time: 01/24/22 12:52 PM   Specimen: Urine, Clean Catch  Result Value Ref Range Status   Specimen Description   Final    URINE, CLEAN CATCH Performed at [facility], [address] [place], [place] [address]    Special Requests   Final    NONE Performed at [facility], [address], [place], [place] [address]    Culture >=100,000 COLONIES/mL ESCHERICHIA COLI (A)  Final   Report Status 01/27/2022 FINAL  Final   Organism ID, Bacteria ESCHERICHIA COLI (A)  Final      Susceptibility   Escherichia coli - MIC*    AMPICILLIN <=2 SENSITIVE Sensitive     CEFAZOLIN <=4 SENSITIVE Sensitive     CEFEPIME <=0.12 SENSITIVE Sensitive     CEFTRIAXONE <=0.25 SENSITIVE Sensitive     CIPROFLOXACIN >=4 RESISTANT Resistant     GENTAMICIN <=1 SENSITIVE Sensitive     IMIPENEM <=0.25 SENSITIVE Sensitive     NITROFURANTOIN <=16 SENSITIVE Sensitive     TRIMETH/SULFA <=20 SENSITIVE Sensitive     AMPICILLIN/SULBACTAM <=2 SENSITIVE Sensitive     PIP/TAZO <=4 SENSITIVE Sensitive     * >=100,000 COLONIES/mL ESCHERICHIA COLI  Resp Panel by RT-PCR (Flu A&B, Covid) Anterior Nasal Swab     Status: None   Collection Time: 01/24/22  1:06 PM   Specimen: Anterior Nasal Swab  Result Value Ref Range Status   SARS Coronavirus 2 by RT PCR NEGATIVE NEGATIVE Final    Comment: (NOTE) SARS-CoV-2 target nucleic acids are NOT DETECTED.  The SARS-CoV-2 RNA is generally detectable in upper respiratory specimens during the acute phase of infection. The lowest concentration of SARS-CoV-2 viral copies this assay can detect is 138 copies/mL. A  negative result does not preclude SARS-Cov-2 infection and should not be used as the sole basis for treatment or other patient management decisions. A negative result may occur with  improper specimen collection/handling, submission of specimen other than nasopharyngeal swab, presence of viral mutation(s) within the areas targeted by this assay, and inadequate number of viral copies(<138 copies/mL). A negative result must be combined with clinical observations, patient history, and epidemiological information. The expected result is Negative.  Fact Sheet for Patients:  BloggerCourse.com  Fact Sheet for Healthcare Providers:  SeriousBroker.it  This test is no t yet approved or cleared by the [place] FDA and  has been authorized for detection and/or diagnosis of SARS-CoV-2 by FDA under an Emergency Use Authorization (EUA). This EUA will remain  in effect (meaning this test can be used) for the duration of the COVID-19 declaration under Section 564(b)(1) of the Act, 21 U.S.C.section 360bbb-3(b)(1), unless the authorization is terminated  or revoked sooner.       Influenza A by PCR NEGATIVE NEGATIVE Final   Influenza B by PCR NEGATIVE NEGATIVE Final    Comment: (NOTE) The Xpert Xpress SARS-CoV-2/FLU/RSV plus assay is intended as an aid in the diagnosis of influenza from Nasopharyngeal swab specimens and should not be used as a sole basis for treatment. Nasal washings and aspirates are unacceptable for Xpert Xpress SARS-CoV-2/FLU/RSV testing.  Fact Sheet for  Patients: BloggerCourse.com  Fact Sheet for Healthcare Providers: SeriousBroker.it  This test is not yet approved or cleared by the [place] FDA and has been authorized for detection and/or diagnosis of SARS-CoV-2 by FDA under an Emergency Use Authorization (EUA). This EUA will remain in effect (meaning this test can  be used) for the duration of the COVID-19 declaration under Section 564(b)(1) of the Act, 21 U.S.C. section 360bbb-3(b)(1), unless the authorization is terminated or revoked.  Performed at [facility], [address], [place], [place] [address]     [x]  Treated with Keflex, but didn't complete full course of antibiotics Keflex 500mg  Q8h 6/27 2 doses, 6/28 3 doses, 6/29 2 doses then transferred to an outside facility with no further antibiotic orders.    []  Patient discharged originally without antimicrobial agent and treatment is now indicated  New antibiotic prescription: Bactrim DS 1 tab BID for 3 days.  ED Provider: 7/28, PA-C Physician Assistant Certified, Emergency Medicine    7/29 PharmD, BCPS Clinical Pharmacist [facility] main pharmacy [phone] 01/28/2022 1:03 PM

## 2022-01-29 NOTE — Telephone Encounter (Signed)
Post ED Visit - Positive Culture Follow-up: Successful Patient Follow-Up  Culture assessed and recommendations reviewed by:  [x]  , RPH []  Lynann Beaver, Pharm.D., BCPS AQ-ID []  , Pharm.D., BCPS []  Celedonio Miyamoto, Pharm.D., BCPS []  [place], Garvin Fila.D., BCPS, AAHIVP []  , Pharm.D., BCPS, AAHIVP []  Georgina Pillion, PharmD, BCPS []  , PharmD, BCPS []  [place], PharmD, BCPS []  [address], PharmD  Positive urine culture Pt  was treated with Keflex but didn't complete course of Keflex and was transferred to [facility] antibiotic Bactrim DS 1 tab BID x 3 days   Changes discussed with ED provider: Estella Husk, PA Faxed to [facility] center [phone]  Contacted facility [phone], date 01/29/22, time 0820   01/29/2022, 8:23 AM

## 2022-06-17 ENCOUNTER — Encounter (HOSPITAL_COMMUNITY): Payer: Self-pay

## 2022-06-17 ENCOUNTER — Other Ambulatory Visit: Payer: Self-pay

## 2022-06-17 ENCOUNTER — Emergency Department (HOSPITAL_COMMUNITY)
Admission: EM | Admit: 2022-06-17 | Discharge: 2022-06-20 | Disposition: A | Discharge status: Other Acute Inpatient Hospital | Payer: Medicare Other | Attending: Emergency Medicine | Admitting: Emergency Medicine

## 2022-06-17 DIAGNOSIS — F250 Schizoaffective disorder, bipolar type: Secondary | ICD-10-CM | POA: Diagnosis not present

## 2022-06-17 DIAGNOSIS — Z20822 Contact with and (suspected) exposure to covid-19: Secondary | ICD-10-CM | POA: Diagnosis not present

## 2022-06-17 DIAGNOSIS — R45850 Homicidal ideations: Secondary | ICD-10-CM | POA: Diagnosis not present

## 2022-06-17 DIAGNOSIS — F22 Delusional disorders: Secondary | ICD-10-CM | POA: Insufficient documentation

## 2022-06-17 DIAGNOSIS — R443 Hallucinations, unspecified: Secondary | ICD-10-CM | POA: Diagnosis present

## 2022-06-17 DIAGNOSIS — R45851 Suicidal ideations: Secondary | ICD-10-CM | POA: Insufficient documentation

## 2022-06-17 DIAGNOSIS — F259 Schizoaffective disorder, unspecified: Secondary | ICD-10-CM

## 2022-06-17 DIAGNOSIS — J449 Chronic obstructive pulmonary disease, unspecified: Secondary | ICD-10-CM | POA: Diagnosis not present

## 2022-06-17 LAB — CBC WITH DIFFERENTIAL/PLATELET
Abs Immature Granulocytes: 0.01 10*3/uL (ref 0.00–0.07)
Basophils Absolute: 0 10*3/uL (ref 0.0–0.1)
Basophils Relative: 1 %
Eosinophils Absolute: 0 10*3/uL (ref 0.0–0.5)
Eosinophils Relative: 1 %
HCT: 39.7 % (ref 36.0–46.0)
Hemoglobin: 12.8 g/dL (ref 12.0–15.0)
Immature Granulocytes: 0 %
Lymphocytes Relative: 28 %
Lymphs Abs: 1.2 10*3/uL (ref 0.7–4.0)
MCH: 32.4 pg (ref 26.0–34.0)
MCHC: 32.2 g/dL (ref 30.0–36.0)
MCV: 100.5 fL — ABNORMAL HIGH (ref 80.0–100.0)
Monocytes Absolute: 0.4 10*3/uL (ref 0.1–1.0)
Monocytes Relative: 9 %
Neutro Abs: 2.6 10*3/uL (ref 1.7–7.7)
Neutrophils Relative %: 61 %
Platelets: 195 10*3/uL (ref 150–400)
RBC: 3.95 MIL/uL (ref 3.87–5.11)
RDW: 13.2 % (ref 11.5–15.5)
WBC: 4.2 10*3/uL (ref 4.0–10.5)
nRBC: 0 % (ref 0.0–0.2)

## 2022-06-17 LAB — RAPID URINE DRUG SCREEN, HOSP PERFORMED
Amphetamines: NOT DETECTED
Barbiturates: NOT DETECTED
Benzodiazepines: NOT DETECTED
Cocaine: NOT DETECTED
Opiates: NOT DETECTED
Tetrahydrocannabinol: NOT DETECTED

## 2022-06-17 LAB — COMPREHENSIVE METABOLIC PANEL
ALT: 20 U/L (ref 0–44)
AST: 24 U/L (ref 15–41)
Albumin: 4.1 g/dL (ref 3.5–5.0)
Alkaline Phosphatase: 63 U/L (ref 38–126)
Anion gap: 6 (ref 5–15)
BUN: 20 mg/dL (ref 8–23)
CO2: 22 mmol/L (ref 22–32)
Calcium: 9.1 mg/dL (ref 8.9–10.3)
Chloride: 114 mmol/L — ABNORMAL HIGH (ref 98–111)
Creatinine, Ser: 1.27 mg/dL — ABNORMAL HIGH (ref 0.44–1.00)
GFR, Estimated: 45 mL/min — ABNORMAL LOW (ref >60)
Glucose, Bld: 118 mg/dL — ABNORMAL HIGH (ref 70–99)
Potassium: 4.1 mmol/L (ref 3.5–5.1)
Sodium: 142 mmol/L (ref 135–145)
Total Bilirubin: 0.9 mg/dL (ref 0.3–1.2)
Total Protein: 7 g/dL (ref 6.5–8.1)

## 2022-06-17 LAB — ETHANOL: Alcohol, Ethyl (B): <10 mg/dL

## 2022-06-17 LAB — SARS CORONAVIRUS 2 BY RT PCR: SARS Coronavirus 2 by RT PCR: NEGATIVE

## 2022-06-17 MED ORDER — ZOLPIDEM TARTRATE 5 MG PO TABS: 10.0000 mg | ORAL_TABLET | Freq: Every day | ORAL | Status: DC

## 2022-06-17 MED ORDER — QUETIAPINE FUMARATE 300 MG PO TABS
300.0000 mg | ORAL_TABLET | Freq: Every day | ORAL | Status: DC
Start: 2022-06-17 — End: 2022-06-20
  Administered 2022-06-17 – 2022-06-19 (×3): 300 mg via ORAL
  Filled 2022-06-17 (×3): qty 1

## 2022-06-17 MED ORDER — PANTOPRAZOLE SODIUM 40 MG PO TBEC
40.0000 mg | DELAYED_RELEASE_TABLET | Freq: Every day | ORAL | Status: DC
  Administered 2022-06-17: 40 mg via ORAL

## 2022-06-17 MED ORDER — OLANZAPINE 5 MG PO TBDP
15.0000 mg | ORAL_TABLET | Freq: Every day | ORAL | Status: DC
  Administered 2022-06-17 – 2022-06-19 (×3): 15 mg via ORAL
  Filled 2022-06-17 (×3): qty 1

## 2022-06-17 MED ORDER — ZOLPIDEM TARTRATE 5 MG PO TABS
5.0000 mg | ORAL_TABLET | Freq: Every day | ORAL | Status: DC
  Administered 2022-06-17 – 2022-06-19 (×3): 5 mg via ORAL
  Filled 2022-06-17 (×3): qty 1

## 2022-06-17 MED ORDER — SERTRALINE HCL 50 MG PO TABS
25.0000 mg | ORAL_TABLET | Freq: Every morning | ORAL | Status: DC
  Administered 2022-06-19 – 2022-06-20 (×2): 25 mg via ORAL
  Filled 2022-06-17 (×3): qty 1

## 2022-06-17 MED ORDER — PANTOPRAZOLE SODIUM 40 MG PO TBEC
40.0000 mg | DELAYED_RELEASE_TABLET | Freq: Every day | ORAL | Status: DC
  Administered 2022-06-18 – 2022-06-19 (×2): 40 mg via ORAL
  Filled 2022-06-17 (×3): qty 1

## 2022-06-17 MED ORDER — ACETAMINOPHEN 500 MG PO TABS
500.0000 mg | ORAL_TABLET | Freq: Four times a day (QID) | ORAL | Status: DC
  Administered 2022-06-17 – 2022-06-20 (×6): 500 mg via ORAL
  Filled 2022-06-17 (×6): qty 1

## 2022-06-17 MED ORDER — LORAZEPAM 0.5 MG PO TABS
0.5000 mg | ORAL_TABLET | Freq: Three times a day (TID) | ORAL | Status: DC | PRN
  Administered 2022-06-18 – 2022-06-19 (×4): 0.5 mg via ORAL
  Filled 2022-06-17 (×4): qty 1

## 2022-06-17 NOTE — ED Triage Notes (Addendum)
Pt presents via EMS from [place] with c/o hallucinations, suicidal ideations, and homicidal ideations. Pt alert and oriented, in assisted living. Per EMS, family went to visit, said she has been crying the whole day, talking to herself and saying that she is ready to die. She said she wants everyone to go with her when she dies.
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
## 2022-06-17 NOTE — ED Provider Notes (Signed)
[facility]-EMERGENCY DEPT Provider Note   CSN: [id number] Arrival date & time: 06/17/22  1327     History  Chief Complaint  Patient presents with   Suicidal   Homicidal    Deanna Rhodes is a 71 y.o. female.  With history of schizoaffective disorder, GERD, COPD who presents to the ED via EMS from [place] [facility] for evaluation of hallucinations, suicidal ideations, and homicidal ideations.  On my evaluation patient states that she feels like dying and that she thinks everyone else is going to die as well.  She states that there are people out to get her.  She believes her nursing staff at [facility] is drugging her.  When asked if how often she has suicidal ideations she states that she has them very often.  She states she is having them now.  She does have a plan.  She states she would run a warm bath for herself and slit her wrists.  She also states she would like to harm other people but not kill them.  She does not have a plan for this.  She notes that she is having auditory and visual hallucinations.  States this is not new and that she always has this.  She states she is taking the medications that they give her at the assisted living at home.  States she is not taking any other medications or illegal drugs.  She states she is okay with staying for psychiatric evaluation.  Denies other complaints.  HPI     Home Medications Prior to Admission medications   Medication Sig Start Date End Date Taking? Authorizing Provider  acetaminophen (TYLENOL) 500 MG tablet Take 500 mg by mouth 4 (four) times daily.    [provider]  benztropine (COGENTIN) 1 MG tablet Take 1 mg by mouth 2 (two) times daily.    [provider]  Cholestyramine POWD Take 2 g by mouth 2 (two) times daily.    [provider]  cloZAPine (CLOZARIL) 100 MG tablet Take 100 mg by mouth at bedtime.    [provider]  clozapine (CLOZARIL) 50 MG tablet Take 50 mg by  mouth in the morning.    [provider]  divalproex (DEPAKOTE SPRINKLE) 125 MG capsule Take 500 mg by mouth 2 (two) times daily.    [provider]  docusate sodium (COLACE) 100 MG capsule Take 100 mg by mouth daily.    [provider]  folic acid (FOLVITE) 1 MG tablet Take 1 mg by mouth daily.    [provider]  LORazepam (ATIVAN) 0.5 MG tablet Take 0.5 mg by mouth every 8 (eight) hours as needed for anxiety.    [provider]  Multiple Vitamin (MULTIVITAMIN) tablet Take 1 tablet by mouth daily.    [provider]  OLANZapine zydis (ZYPREXA) 10 MG disintegrating tablet Take 10 mg by mouth at bedtime.    [provider]  omeprazole (PRILOSEC) 20 MG capsule Take 20 mg by mouth daily.    [provider]  sertraline (ZOLOFT) 25 MG tablet Take 25 mg by mouth in the morning.    [provider]  thiamine 100 MG tablet Take 100 mg by mouth daily.    [provider]      Allergies    Codeine and Imdur [isosorbide nitrate]    Review of Systems   Review of Systems  Psychiatric/Behavioral:  Positive for hallucinations and suicidal ideas.   All other systems reviewed and  are negative.   Physical Exam Updated Vital Signs BP 132/81 (BP Location: Right Arm)   Pulse 73   Temp 97.9 F (36.6 C) (Oral)   Resp 20   SpO2 99%  Physical Exam Vitals and nursing note reviewed.  Constitutional:      General: She is not in acute distress.    Appearance: Normal appearance. She is normal weight. She is not ill-appearing, toxic-appearing or diaphoretic.  HENT:     Head: Normocephalic and atraumatic.  Cardiovascular:     Rate and Rhythm: Normal rate and regular rhythm.     Pulses: Normal pulses.     Heart sounds: No murmur heard. Pulmonary:     Effort: Pulmonary effort is normal. No respiratory distress.     Breath sounds: Normal breath sounds. No stridor. No wheezing, rhonchi or rales.  Abdominal:     General:  Abdomen is flat.  Musculoskeletal:        General: Normal range of motion.     Cervical back: Neck supple. No tenderness.  Skin:    General: Skin is warm and dry.  Neurological:     Mental Status: She is alert and oriented to person, place, and time.  Psychiatric:        Attention and Perception: She perceives auditory and visual hallucinations.        Mood and Affect: Mood normal.        Behavior: Behavior is hyperactive. Behavior is cooperative.        Thought Content: Thought content is paranoid and delusional. Thought content includes suicidal ideation. Thought content includes suicidal plan.        Judgment: Judgment is impulsive.     ED Results / Procedures / Treatments   Labs (all labs ordered are listed, but only abnormal results are displayed) Labs Reviewed  COMPREHENSIVE METABOLIC PANEL - Abnormal; Notable for the following components:      Result Value   Chloride 114 (*)    Glucose, Bld 118 (*)    Creatinine, Ser 1.27 (*)    GFR, Estimated 45 (*)    All other components within normal limits  CBC WITH DIFFERENTIAL/PLATELET - Abnormal; Notable for the following components:   MCV 100.5 (*)    All other components within normal limits  ETHANOL  RAPID URINE DRUG SCREEN, HOSP PERFORMED    EKG None  Radiology No results found.  Procedures Procedures    Medications Ordered in ED Medications - No data to display  ED Course/ Medical Decision Making/ A&P                           Medical Decision Making This patient presents to the ED for concern of suicidal ideation, and auditory and visual hallucinations, this involves an extensive number of treatment options, and is a complaint that carries with it a high risk of complications and morbidity.    Co morbidities that complicate the patient evaluation   schizoaffective disorder  My initial workup includes medical clearance  Additional history obtained from: Nursing notes from this visit. EMS provides a  portion of the history  I ordered, reviewed and interpreted labs which include: CBC, CMP, ethanol  Afebrile, hemodynamically stable.  71 year old female with a significant psychiatric history presenting to the ED for evaluation of suicidal ideations as well as auditory and visual hallucinations as described above. Patient is agreeable to staying for psychiatric evaluation.  Lab work-up unremarkable.  Physical exam unremarkable.  Patient is medically cleared for psychiatric disposition.  Patient's case discussed with Dr. Jeraldine Loots who agrees with plan to consult TTS.   Note: Portions of this report may have been transcribed using voice recognition software. Every effort was made to ensure accuracy; however, inadvertent computerized transcription errors may still be present.          Final Clinical Impression(s) / ED Diagnoses Final diagnoses:  None    Rx / DC Orders ED Discharge Orders     None         Mora Bellman 06/17/22 1755    Gerhard Munch, MD 06/17/22 2126

## 2022-06-17 NOTE — ED Provider Triage Note (Signed)
Emergency Medicine Provider Triage Evaluation Note  Tanara Turvey , a 71 y.o. female  was evaluated in triage.  Patient is here for medical clearance with SI/HI.  States she has thoughts of hurting self, says she might slit her wrist.  States she also has thoughts of hurting other people.  Endorses chest pain.  Per EMS patient reportedly has been crying the whole day, talking to herself.  No shortness of breath, nausea, vomiting, bowel changes, urinary symptoms, fever, rash.  Review of Systems  Positive: As above Negative: As above  Physical Exam  BP (!) 150/92 (BP Location: Left Arm)   Pulse 85   Temp 98.7 F (37.1 C) (Oral)   Resp 18   SpO2 98%  Gen:   Awake, tearful Resp:  Normal effort  MSK:   Moves extremities without difficulty  Other:    Medical Decision Making  Medically screening exam initiated at 1:40 PM.  Appropriate orders placed.  Jillann Charette was informed that the remainder of the evaluation will be completed by another provider, this initial triage assessment does not replace that evaluation, and the importance of remaining in the ED until their evaluation is complete.     Jeanelle Malling, [place] 06/17/22 2252

## 2022-06-17 NOTE — ED Notes (Signed)
Pt talking to herself, singing, 'if you hurt my family I'll kill every last one of you regardless of race. You think I'm joking but I'm not.'

## 2022-06-18 DIAGNOSIS — F250 Schizoaffective disorder, bipolar type: Secondary | ICD-10-CM | POA: Diagnosis not present

## 2022-06-18 MED ORDER — ONDANSETRON 4 MG PO TBDP
4.0000 mg | ORAL_TABLET | Freq: Once | ORAL | Status: AC
  Administered 2022-06-18: 4 mg via ORAL
  Filled 2022-06-18: qty 1

## 2022-06-18 NOTE — ED Notes (Signed)
Pt awake OOB to BR gait steady denies pain or need. Return to bed without incident.

## 2022-06-18 NOTE — ED Notes (Signed)
Patient alert. Rambling at times. Labile with emotions at times. Tearful and anxious at times.  Patient refused prescribed Zoloft this AM.

## 2022-06-18 NOTE — ED Provider Notes (Signed)
Emergency Medicine Observation Re-evaluation Note  Deanna Rhodes is a 71 y.o. female, seen on rounds today.  Pt initially presented to the ED for complaints of BH eval, pt noted to be making statements containing SI and HI.  Currently resting, calm.   Physical Exam  BP (!) 150/86 (BP Location: Right Arm)   Pulse 73   Temp 98.6 F (37 C)   Resp 19   SpO2 100%  Physical Exam General: resting.  Cardiac: regular rate. Lungs: breathing comfortably. Psych: calm, resting.   ED Course / MDM    I have reviewed the labs performed to date as well as medications administered while in observation.  Recent changes in the last 24 hours include ED obs, reassessment, med management.   Plan  BH team is recommending inpatient psych.    If patients symptoms present and continue on current med regimen, it appears opportunity for our [facility] team to adjust meds while patient awaiting possible inpatient [facility] placement.       Cathren Laine, MD 06/18/22 [phone]

## 2022-06-18 NOTE — Progress Notes (Signed)
Per  Cecilio Asper, NP, patient meets criteria for inpatient treatment. There are no available beds at [facility] today. CSW faxed referrals to the following facilities for review:  [facility]  Pending - Request Sent N/A [address] [place] [address] [phone] --  [facility]  Pending - Request Sent N/A [address] [place] [address] [phone] --  [facility]  Pending - Request Sent N/A [address] [place] [address] [phone] --  [facility]  Pending - Request Sent N/A [address] [place], [place] [address] [phone] --  [facility]  Pending - Request Sent N/A [address] [place] [address] [phone] --  [facility]  Pending - Request Sent N/A [address], [place] [address] [phone] --  [facility]  Pending - Request Sent N/A [address] [place] [address] [phone] --  [facility]  Pending - Request Sent N/A [address], [place] [address] [phone] --  [facility]  Pending - Request Sent N/A [address] [place] [address] [phone] --   TTS will continue to seek bed placement.  Crissie Reese, MSW, [facility] Pressman Phone: [phone] Disposition/TOC

## 2022-06-18 NOTE — Progress Notes (Signed)
CSW spoke with Malachi Bonds with [facility] admissions, it was reported that this patient is under further review.  Crissie Reese, MSW, [facility] Phone: [phone] Disposition/TOC

## 2022-06-19 DIAGNOSIS — F250 Schizoaffective disorder, bipolar type: Secondary | ICD-10-CM | POA: Diagnosis not present

## 2022-06-19 NOTE — ED Provider Notes (Signed)
Emergency Medicine Observation Re-evaluation Note  Deanna Rhodes is a 71 y.o. female, seen on rounds today.  Pt initially presented to the ED for complaints of Suicidal and Homicidal Currently, the patient is in no acute distress.  Upset that she cannot have her nails done or cut her fingernails or toenails.  Physical Exam  BP 127/81   Pulse 78   Temp 98.7 F (37.1 C)   Resp 18   SpO2 100%  Physical Exam General: No acute distress Cardiac: Regular rate Lungs: Breathing easily Psych: Flat affect  ED Course / MDM  EKG:EKG Interpretation  Date/Time:  Saturday June 17 2022 13:46:21 EST Ventricular Rate:  85 PR Interval:  164 QRS Duration: 93 QT Interval:  377 QTC Calculation: 449 R Axis:   -48 Text Interpretation: Sinus rhythm Left anterior fascicular block Left ventricular hypertrophy Anterior infarct, old Artifact Abnormal ECG Confirmed by Gerhard Munch [phone]) on 06/17/2022 5:52:23 PM  I have reviewed the labs performed to date as well as medications administered while in observation.  Recent changes in the last 24 hours include no acute changes.  Plan  Current plan is for inpatient psychiatric admission.    Linwood Dibbles, MD 06/19/22 [phone]

## 2022-06-19 NOTE — ED Notes (Signed)
Patient alert this shift.  Patient medication compliant.  Patient rambling at times.  Disorganized. Labile with emotions. Irritable at times.  Redirectable.  Patient endorsed auditory and visual hallucinations  No suicidal ideation noted.Deanna Rhodes

## 2022-06-19 NOTE — Progress Notes (Signed)
CSW requested [facility] review. CSW will assist and follow with placement.   Maryjean Ka, MSW, [facility] 06/19/2022 8:31 AM

## 2022-06-19 NOTE — ED Notes (Addendum)
Patient struggled to fall asleep. Patient made verbal threats to staff. Patient had auditory hallucinations and was conversing with people she called "the people you can't see". Patient had an instance of gaging/ retching and was given zofran per [facility] after this instance pt fell asleep. Pt woke up a few hours later and demanded ice water which was provided. Pt started attempting to get out of bed to turn on lights. Pt is on a bed alarm and was prevented from exiting the bed in an unsafe manner.  Pt instructed that it is time to sleep. Pt struggled to fall asleep. Patient did fall asleep after this point.

## 2022-06-19 NOTE — ED Notes (Addendum)
Early in shift Deanna Rhodes  was having a rambling profane conversation with self and when approached by staff began using racial slurs call staff the N-word.  She remains religiously preoccupied and began reading her bible after which she apologized to staff members for racial slurs.

## 2022-06-19 NOTE — ED Notes (Signed)
Pt found to still have a necklace. Neckalace was removed and placed in locker with the rest of her belongings

## 2022-06-19 NOTE — Progress Notes (Signed)
Inpatient Behavioral Health Placement  Pt meets inpatient criteria per Cecilio Asper, NP. There are no available beds at [facility].  Referral was sent to the following facilities;    estination  Service Provider Address Phone Fax  [facility]  [address], [place] [address] [phone]  [facility]  [address], [place] [address] [phone]  [facility]  [address] [place] [address] [phone]  [facility]  [address] [place], [place] [address] [phone]  [facility]  [address], [place] [address] [phone]  [facility]  [address], [place] [address] [phone]  [facility]  [address] [place] [address] [phone]  [facility]  [address] [place] [address] [phone]  [facility]  [address], [place] [address] [phone]  [facility]  [address] [place] [address] [phone]  [facility]  [address] [place] [address] [phone]  [facility]  [address] [place], [place] [address] [phone]  [facility]  [address] [place] [address] [phone]  [facility]  [address] [place], [place] [address] [phone]  [facility] [place] [address] [phone]  [facility]  [address] [place] [address] [phone]  [facility]  [address], [place] [address] [phone]  [facility]  [address] [place] [address], [place] [address] [phone]  [facility]  [address], [place] [address] [phone]  [facility]  [address] [place] [address] [phone]  [facility]  [address] [place] [address] [phone]  [facility]  [address] [place] [address] [phone]  [facility]  [address], [place] [address] [phone]   Situation ongoing,  CSW will follow up.   Maryjean Ka, MSW, [facility] 06/19/2022  @ 4:24 PM

## 2022-06-19 NOTE — Progress Notes (Signed)
Pt was accepted to [facility] 06/20/[facility]; [facility]  Pt meets inpatient criteria per Cecilio Asper, NP   Attending Physician will be Dr. Estill Cotta  Report can be called to: - [phone]*Pager  Pt can arrive after 8:00am  Care Team notified: Lum Babe, RN, Dahlia Byes, NP  Kelton Pillar, LCSWA 06/19/2022 @ 4:59 PM

## 2022-06-20 DIAGNOSIS — F250 Schizoaffective disorder, bipolar type: Secondary | ICD-10-CM | POA: Diagnosis not present

## 2022-06-20 MED ORDER — MELATONIN 3 MG PO TABS
3.0000 mg | ORAL_TABLET | Freq: Every day | ORAL | Status: DC
  Administered 2022-06-20: 3 mg via ORAL
  Filled 2022-06-20: qty 1

## 2022-06-20 MED ORDER — LORAZEPAM 0.5 MG PO TABS
0.5000 mg | ORAL_TABLET | Freq: Once | ORAL | Status: AC
  Administered 2022-06-20: 0.5 mg via ORAL
  Filled 2022-06-20: qty 1

## 2022-06-20 NOTE — ED Notes (Signed)
Currently sleeping with no distress noted.

## 2022-06-20 NOTE — ED Notes (Signed)
[facility] called, transport to [facility] arranged. Per [facility] they should arrive at 0900 to take pt to [facility].

## 2022-06-20 NOTE — ED Notes (Signed)
Report called to Lyla Son, Charity fundraiser at [facility].

## 2022-06-20 NOTE — ED Provider Notes (Signed)
Emergency Medicine Observation Re-evaluation Note  Deanna Rhodes is a 72 y.o. female, seen on rounds today.  Pt initially presented to the ED for complaints of Suicidal and Homicidal Currently, the patient is medically stable.  Physical Exam  BP (!) 144/83 (BP Location: Right Arm)   Pulse 86   Temp 98.4 F (36.9 C) (Oral)   Resp 16   SpO2 96%  Physical Exam General: elderly female nad Cardiac: rrr Lungs: no respiratory distress Psych: stable  ED Course / MDM  EKG:EKG Interpretation  Date/Time:  Saturday June 17 2022 13:46:21 EST Ventricular Rate:  85 PR Interval:  164 QRS Duration: 93 QT Interval:  377 QTC Calculation: 449 R Axis:   -48 Text Interpretation: Sinus rhythm Left anterior fascicular block Left ventricular hypertrophy Anterior infarct, old Artifact Abnormal ECG Confirmed by Gerhard Munch [phone]) on 06/17/2022 5:52:23 PM  I have reviewed the labs performed to date as well as medications administered while in observation.  Recent changes in the last 24 hours include accepted to [facility].  Plan  Current plan is for transfer to [facility]  .    Margarita Grizzle, MD 06/20/22 [phone]

## 2022-06-20 NOTE — ED Notes (Signed)
Mrs Poe has requested something for sleep and medication requested from EDP for sleep.

## 2022-06-20 NOTE — ED Notes (Signed)
Remains awake no distress noted will offer sleep medication.

## 2022-10-11 ENCOUNTER — Encounter (HOSPITAL_COMMUNITY): Payer: Self-pay

## 2022-10-11 ENCOUNTER — Emergency Department (HOSPITAL_COMMUNITY)
Admission: EM | Admit: 2022-10-11 | Discharge: 2022-10-12 | Disposition: A | Discharge status: Other Acute Inpatient Hospital | Payer: Medicare Other | Attending: Emergency Medicine | Admitting: Emergency Medicine

## 2022-10-11 ENCOUNTER — Other Ambulatory Visit: Payer: Self-pay

## 2022-10-11 ENCOUNTER — Emergency Department (HOSPITAL_COMMUNITY): Payer: Medicare Other

## 2022-10-11 DIAGNOSIS — R079 Chest pain, unspecified: Secondary | ICD-10-CM | POA: Diagnosis not present

## 2022-10-11 DIAGNOSIS — Z20822 Contact with and (suspected) exposure to covid-19: Secondary | ICD-10-CM | POA: Diagnosis not present

## 2022-10-11 DIAGNOSIS — F22 Delusional disorders: Secondary | ICD-10-CM | POA: Insufficient documentation

## 2022-10-11 DIAGNOSIS — R109 Unspecified abdominal pain: Secondary | ICD-10-CM | POA: Insufficient documentation

## 2022-10-11 DIAGNOSIS — F250 Schizoaffective disorder, bipolar type: Secondary | ICD-10-CM | POA: Diagnosis present

## 2022-10-11 DIAGNOSIS — J449 Chronic obstructive pulmonary disease, unspecified: Secondary | ICD-10-CM | POA: Insufficient documentation

## 2022-10-11 LAB — COMPREHENSIVE METABOLIC PANEL
ALT: 13 U/L (ref 0–44)
AST: 20 U/L (ref 15–41)
Albumin: 4.3 g/dL (ref 3.5–5.0)
Alkaline Phosphatase: 74 U/L (ref 38–126)
Anion gap: 11 (ref 5–15)
BUN: 16 mg/dL (ref 8–23)
CO2: 21 mmol/L — ABNORMAL LOW (ref 22–32)
Calcium: 9.1 mg/dL (ref 8.9–10.3)
Chloride: 109 mmol/L (ref 98–111)
Creatinine, Ser: 1.08 mg/dL — ABNORMAL HIGH (ref 0.44–1.00)
GFR, Estimated: 55 mL/min — ABNORMAL LOW (ref >60)
Glucose, Bld: 93 mg/dL (ref 70–99)
Potassium: 4 mmol/L (ref 3.5–5.1)
Sodium: 141 mmol/L (ref 135–145)
Total Bilirubin: 1.4 mg/dL — ABNORMAL HIGH (ref 0.3–1.2)
Total Protein: 7.2 g/dL (ref 6.5–8.1)

## 2022-10-11 LAB — URINALYSIS, ROUTINE W REFLEX MICROSCOPIC
Bilirubin Urine: NEGATIVE
Glucose, UA: NEGATIVE mg/dL
Hgb urine dipstick: NEGATIVE
Ketones, ur: 20 mg/dL — AB
Leukocytes,Ua: NEGATIVE
Nitrite: NEGATIVE
Protein, ur: NEGATIVE mg/dL
Specific Gravity, Urine: 1.008 (ref 1.005–1.030)
pH: 5 (ref 5.0–8.0)

## 2022-10-11 LAB — CBC WITH DIFFERENTIAL/PLATELET
Abs Immature Granulocytes: 0.01 10*3/uL (ref 0.00–0.07)
Basophils Absolute: 0 10*3/uL (ref 0.0–0.1)
Basophils Relative: 1 %
Eosinophils Absolute: 0.1 10*3/uL (ref 0.0–0.5)
Eosinophils Relative: 2 %
HCT: 43.3 % (ref 36.0–46.0)
Hemoglobin: 13.6 g/dL (ref 12.0–15.0)
Immature Granulocytes: 0 %
Lymphocytes Relative: 30 %
Lymphs Abs: 1.4 10*3/uL (ref 0.7–4.0)
MCH: 31.9 pg (ref 26.0–34.0)
MCHC: 31.4 g/dL (ref 30.0–36.0)
MCV: 101.4 fL — ABNORMAL HIGH (ref 80.0–100.0)
Monocytes Absolute: 0.4 10*3/uL (ref 0.1–1.0)
Monocytes Relative: 9 %
Neutro Abs: 2.7 10*3/uL (ref 1.7–7.7)
Neutrophils Relative %: 58 %
Platelets: 251 10*3/uL (ref 150–400)
RBC: 4.27 MIL/uL (ref 3.87–5.11)
RDW: 13.3 % (ref 11.5–15.5)
WBC: 4.6 10*3/uL (ref 4.0–10.5)
nRBC: 0 % (ref 0.0–0.2)

## 2022-10-11 LAB — RAPID URINE DRUG SCREEN, HOSP PERFORMED
Amphetamines: NOT DETECTED
Barbiturates: NOT DETECTED
Benzodiazepines: NOT DETECTED
Cocaine: NOT DETECTED
Opiates: NOT DETECTED
Tetrahydrocannabinol: NOT DETECTED

## 2022-10-11 LAB — ACETAMINOPHEN LEVEL: Acetaminophen (Tylenol), Serum: <10 ug/mL — ABNORMAL LOW (ref 10–30)

## 2022-10-11 LAB — ETHANOL: Alcohol, Ethyl (B): <10 mg/dL (ref <10)

## 2022-10-11 LAB — HIV ANTIBODY (ROUTINE TESTING W REFLEX): HIV Screen 4th Generation wRfx: NONREACTIVE

## 2022-10-11 LAB — RESP PANEL BY RT-PCR (RSV, FLU A&B, COVID)  RVPGX2
Influenza A by PCR: NEGATIVE
Influenza B by PCR: NEGATIVE
Resp Syncytial Virus by PCR: NEGATIVE
SARS Coronavirus 2 by RT PCR: NEGATIVE

## 2022-10-11 LAB — SALICYLATE LEVEL: Salicylate Lvl: <7 mg/dL — ABNORMAL LOW (ref 7.0–30.0)

## 2022-10-11 NOTE — ED Provider Notes (Signed)
[place] EMERGENCY DEPARTMENT AT [facility] Provider Note   CSN: [id number] Arrival date & time: 10/11/22  1438     History  Chief Complaint  Patient presents with   Psychiatric Evaluation    Deanna Rhodes is a 72 y.o. female.  Patient is a 72 year old female with a past medical history of schizoaffective, GERD and COPD presenting to the emergency department for delusions.  The patient states that people are telling her and everyone and that "she is a whore" and that she has "AIDS".  She states that people "go in her brain and tell her what to say and she has no control over what she is saying".  She states that "people are trying to remove her breasts and stab her abdomen".  She states that she has had similar symptoms happen before.  She states that she is having chest and abdominal pain from the "people trying to remove her breast and stabbed her abdomen".  She denies any recent fevers, nausea, vomiting or diarrhea, dysuria or hematuria.  She reports that she has been taking her medications as prescribed.  I spoke with nursing staff at [facility] who states that they had a room change and triggered her starting have delusions "talking to the devil". She never becomes a physical threat. She gets frantic and manic requesting to call her daughter making threats.They state she has been taking her olanzapine, otherwise has had no sick symptoms.   The history is provided by the patient and the EMS personnel.       Home Medications Prior to Admission medications   Medication Sig Start Date End Date Taking? Authorizing Provider  acetaminophen (TYLENOL) 500 MG tablet Take 500 mg by mouth 4 (four) times daily.    [provider]  docusate sodium (COLACE) 100 MG capsule Take 100 mg by mouth See admin instructions. Take 100 mg by mouth in the morning and hold for loose stools    [provider]  Multiple Vitamin (MULTIVITAMIN) tablet Take 1 tablet by mouth  daily with breakfast.    [provider]  OLANZapine zydis (ZYPREXA) 10 MG disintegrating tablet Take 15 mg by mouth at bedtime.    [provider]  omeprazole (PRILOSEC) 20 MG capsule Take 20 mg by mouth daily before breakfast.    [provider]  polyethylene glycol powder (GLYCOLAX/MIRALAX) 17 GM/SCOOP powder Take 17 g by mouth daily as needed for mild constipation (mix and drink).    [provider]  QUEtiapine (SEROQUEL) 100 MG tablet Take 300 mg by mouth at bedtime.    [provider]  sertraline (ZOLOFT) 25 MG tablet Take 25 mg by mouth in the morning.    [provider]  triamcinolone cream (KENALOG) 0.1 % Apply 1 Application topically See admin instructions. Apply topically to affected area(s) once a day    [provider]  zolpidem (AMBIEN) 10 MG tablet Take 10 mg by mouth at bedtime.    [provider]      Allergies    Codeine and Imdur [isosorbide nitrate]    Review of Systems   Review of Systems  Physical Exam Updated Vital Signs BP (!) 144/84   Pulse 87   Temp 98.3 F (36.8 C) (Oral)   Resp 18   Ht '5\' 5"'$  (1.651 m)   Wt 66.7 kg   SpO2 98%   BMI 24.46 kg/m  Physical Exam Vitals and nursing note reviewed.  Constitutional:  General: She is not in acute distress.    Appearance: Normal appearance.  HENT:     Head: Normocephalic and atraumatic.     Nose: Nose normal.     Mouth/Throat:     Mouth: Mucous membranes are moist.     Pharynx: Oropharynx is clear.  Eyes:     Extraocular Movements: Extraocular movements intact.     Conjunctiva/sclera: Conjunctivae normal.  Cardiovascular:     Rate and Rhythm: Normal rate and regular rhythm.     Heart sounds: Normal heart sounds.  Pulmonary:     Effort: Pulmonary effort is normal.     Breath sounds: Normal breath sounds.  Abdominal:     General: Abdomen is flat.     Palpations: Abdomen is soft.     Tenderness: There is no abdominal tenderness.   Musculoskeletal:        General: Normal range of motion.     Cervical back: Normal range of motion and neck supple.  Skin:    General: Skin is warm and dry.  Neurological:     General: No focal deficit present.     Mental Status: She is alert and oriented to person, place, and time.  Psychiatric:        Mood and Affect: Mood normal.     Comments: Active delusions on exam, calm and cooperative     ED Results / Procedures / Treatments   Labs (all labs ordered are listed, but only abnormal results are displayed) Labs Reviewed  COMPREHENSIVE METABOLIC PANEL - Abnormal; Notable for the following components:      Result Value   CO2 21 (*)    Creatinine, Ser 1.08 (*)    Total Bilirubin 1.4 (*)    GFR, Estimated 55 (*)    All other components within normal limits  CBC WITH DIFFERENTIAL/PLATELET - Abnormal; Notable for the following components:   MCV 101.4 (*)    All other components within normal limits  SALICYLATE LEVEL - Abnormal; Notable for the following components:   Salicylate Lvl [id number] (*)    All other components within normal limits  ACETAMINOPHEN LEVEL - Abnormal; Notable for the following components:   Acetaminophen (Tylenol), Serum <10 (*)    All other components within normal limits  URINALYSIS, ROUTINE W REFLEX MICROSCOPIC - Abnormal; Notable for the following components:   Color, Urine STRAW (*)    Ketones, ur 20 (*)    All other components within normal limits  RESP PANEL BY RT-PCR (RSV, FLU A&B, COVID)  [id number]  ETHANOL  RAPID URINE DRUG SCREEN, HOSP PERFORMED  HIV ANTIBODY (ROUTINE TESTING W REFLEX)  RPR    EKG EKG Interpretation  Date/Time:  Wednesday October 11 2022 17:50:04 EDT Ventricular Rate:  77 PR Interval:  168 QRS Duration: 90 QT Interval:  408 QTC Calculation: 461 R Axis:   -60 Text Interpretation: Sinus rhythm with Fusion complexes Left anterior fascicular block Minimal voltage criteria for LVH, may be normal variant ( Cornell product ) Septal  infarct , age undetermined Abnormal ECG Artifact in lead 6 No significant change since last tracing Confirmed by Leanord Asal ([id number]) on 10/11/2022 5:55:58 PM  Radiology No results found.  Procedures Procedures    Medications Ordered in ED Medications - No data to display  ED Course/ Medical Decision Making/ A&P Clinical Course as of 10/11/22 1938  Wed Oct 11, 2022  1937 Patient refused CXR. Labs within normal range. She is medically cleared for psych eval. [VK]  Clinical Course User Index [VK] Kemper Durie, DO                             Medical Decision Making This patient presents to the ED with chief complaint(s) of delusions with pertinent past medical history of schizoaffective disorder, COPD, GERD which further complicates the presenting complaint. The complaint involves an extensive differential diagnosis and also carries with it a high risk of complications and morbidity.    The differential diagnosis includes decompensation of schizoaffective, infection, electrolyte abnormality, intoxication, ACS, arrhythmia, delirium  Additional history obtained: Additional history obtained from EMS  Records reviewed prior ED records  ED Course and Reassessment: On patient's arrival to the emergency department she is calm and cooperative but is having active delusions on exam.  They be consistent with her known psychiatric history however will have labs including urine, chest x-ray and EKG to rule out organic causes for medical clearance prior to psychiatry evaluation.  Independent labs interpretation:  The following labs were independently interpreted: within normal range  Independent visualization of imaging: N/A  Consultation: - Consulted or discussed management/test interpretation w/ external professional: TTS     Amount and/or Complexity of Data Reviewed Labs: ordered. Radiology: ordered.          Final Clinical Impression(s) / ED Diagnoses Final  diagnoses:  Delusions [facility])    Rx / [place] Orders ED Discharge Orders     None         Kemper Durie, DO 10/11/22 1938

## 2022-10-11 NOTE — ED Notes (Signed)
Pt arrived to [place] room 30. Pt ambulatory with steady gait. No S/S of distress noted. Cooperative with staff at this time.

## 2022-10-11 NOTE — ED Notes (Signed)
Requesting to use phone to call daughter, Levada Dy. Dialed phone for patient and gave her the phone.

## 2022-10-11 NOTE — ED Triage Notes (Signed)
Patient brought in by EMS due to needing a psych evaluation. Patient has been hallucinating per [facility]. Pt reports to this nurse, "I am here so I can be checked for HIV and syphilis." Pt also reports, "Everyone needs to stop calling me a whore. Because I am not a whore, are you a whore?" Redirected patient she was at the ER.

## 2022-10-12 DIAGNOSIS — F250 Schizoaffective disorder, bipolar type: Secondary | ICD-10-CM | POA: Diagnosis not present

## 2022-10-12 LAB — RPR: RPR Ser Ql: NONREACTIVE

## 2022-10-12 MED ORDER — OLANZAPINE 5 MG PO TBDP: 5.0000 mg | ORAL_TABLET | Freq: Once | ORAL | Status: DC | PRN

## 2022-10-12 MED ORDER — SERTRALINE HCL 50 MG PO TABS: 25.0000 mg | ORAL_TABLET | Freq: Every morning | ORAL | Status: DC

## 2022-10-12 MED ORDER — LOPERAMIDE HCL 2 MG PO CAPS
2.0000 mg | ORAL_CAPSULE | Freq: Once | ORAL | Status: AC
  Administered 2022-10-12: 2 mg via ORAL
  Filled 2022-10-12: qty 1

## 2022-10-12 NOTE — ED Notes (Signed)
TTS assessment in progress pt appears to be participating.

## 2022-10-12 NOTE — Consult Note (Signed)
[facility] ED ASSESSMENT   Reason for Consult:  Psych Consult Referring Physician:  Dr. Maylon Peppers Patient Identification: Deanna Rhodes MRN:  [id number] ED Chief Complaint: Schizoaffective disorder, bipolar type [facility])  Diagnosis:  Principal Problem:   Schizoaffective disorder, bipolar type [facility])   ED Assessment Time Calculation: Start Time: 1300 Stop Time: 1320 Total Time in Minutes (Assessment Completion): 20   HPI: Per triage note: Patient brought in by EMS due to needing a psych evaluation.  Patient has been hallucinating per [facility].  Patient reports to this nurse, "I am here to start and recheck for HIV and syphilis.  "Patient also reports "everyone needs to stop call me a whore.  Because I am not a whore, are you a whore?  "Redirect the patient she was at the ER.   Subjective:  Deanna Rhodes, 72 y.o., female patient seen face to face by this provider, consulted with Dr. Dwyane Dee; and chart reviewed on 10/12/22.  On evaluation Deanna Rhodes patient stated appetite and sleep are fair, does not like being here in the emergency department.  She denies SI/AVH, but says she wants to hurt everyone who threatened to kill her family.  Provider asked patient who was everyone, patient said "you" and all the other ones who want to hurt me and kill me.  Patient said you all, think you are big shots, trying to make all the money and take over the hospital.  When provider asked patient who was you all, patient stated "black people."  Provider attempted to redirect patient, and let her know that her nail polish was a pretty color and she said do not patronize me, because you all think you are something.  Provider attempted to ask patient about the question, and she stated just leave me alone and get the "fuck" out of my room.  Do not ask me any more questions, do not bother me.  Patient went back to, when in her coloring book and ignored provider.  During evaluation Deanna Rhodes is sitting in room,  coloring in coloring book in no acute distress. She is alert, partially oriented to name and birth date, and anxious, and irritable. Her mood is irritable with congruent affect. She has normal speech.  Poor eye contact.  Objectively there is no evidence of psychosis/mania or delusional thinking. She denies suicidal/self-harm/homicidal ideation, psychosis, and paranoia.  Continue to recommend inpatient psychiatric admission for stabilization and treatment.   Past Psychiatric History: Dementia, Schizoaffective disorder, Bipolar type, Depression and anxiety. Three previous inpatient Geropsych hospitalization at [facility]. Last hospitalization was October-Feb this year.   Risk to Self or Others: Is the patient at risk to self? No Has the patient been a risk to self in the past 6 months? No Has the patient been a risk to self within the distant past? No Is the patient a risk to others? Yes Has the patient been a risk to others in the past 6 months? No Has the patient been a risk to others within the distant past? No  [place] Scale:  [place] ED from 10/11/2022 in [facility] Emergency Department at [facility] ED from 06/17/2022 in [facility] Emergency Department at [facility] ED from 01/24/2022 in [facility] Emergency Department at [place] No Risk High Risk No Risk       AIMS:  , , ,  ,   ASAM:    Substance Abuse:  Alcohol / Drug Use Pain Medications: See PTA  medication list Prescriptions: See PTA medication list Over the Counter: See PTA medication list History of alcohol / drug use?: Yes Longest period of sobriety (when/how long): 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 No pertinent surgical history. Family History: History reviewed. No pertinent family history.  Social History:  Social History   Substance and Sexual Activity  Alcohol Use None     Social History   Substance and Sexual Activity  Drug Use Not on file    Social History   Socioeconomic History   Marital status: Divorced    Spouse name: Not on file   Number of children: Not on file   Years of education: Not on file   Highest education level: Not on file  Occupational History   Not on file  Tobacco Use   Smoking status: Unknown   Smokeless tobacco: Not on file  Substance and Sexual Activity   Alcohol use: Not on file   Drug use: Not on file   Sexual activity: Not on file  Other Topics Concern   Not on file  Social History Narrative   Not on file   Social Determinants of Health   Financial Resource Strain: Not on file  Food Insecurity: Not on file  Transportation Needs: Not on file  Physical Activity: Not on file  Stress: Not on file  Social Connections: Not on file   Additional Social History:    Allergies:   Allergies  Allergen Reactions   Codeine Other (See Comments)    "Allergic," per MAR   Imdur [Isosorbide Nitrate] Other (See Comments)    Listed allergy on MAR     Labs:  Results for orders placed or performed during the hospital encounter of 10/11/22 (from the past 48 hour(s))  Comprehensive metabolic panel     Status: Abnormal   Collection Time: 10/11/22  4:14 PM  Result Value Ref Range   Sodium 141 135 - 145 mmol/L   Potassium 4.0 3.5 - 5.1 mmol/L   Chloride 109 98 - 111 mmol/L   CO2 21 (L) 22 - 32 mmol/L   Glucose, Bld 93 70 - 99 mg/dL    Comment: Glucose reference range applies only to samples taken after fasting for at least 8 hours.   BUN 16 8 - 23 mg/dL   Creatinine, Ser 1.08 (H) 0.44 - 1.00 mg/dL   Calcium 9.1 8.9 - 10.3 mg/dL   Total Protein 7.2 6.5 - 8.1 g/dL   Albumin 4.3 3.5 - 5.0 g/dL   AST 20 15 - 41 U/L   ALT  13 0 - 44 U/L   Alkaline Phosphatase 74 38 - 126 U/L   Total Bilirubin 1.4 (H) 0.3 - 1.2 mg/dL   GFR, Estimated 55 (L) >60 mL/min    Comment: (NOTE) Calculated using the CKD-EPI Creatinine Equation (2021)    Anion gap 11 5 - 15    Comment: Performed at [facility], [place] [address] [place], [place] [address]  Ethanol     Status: None   Collection Time: 10/11/22  4:14 PM  Result Value Ref Range   Alcohol, Ethyl (B) <10 <10 mg/dL    Comment: (NOTE) Lowest detectable limit for serum alcohol  is 10 mg/dL.  For medical purposes only. Performed at [facility] [place] [address], [place], [place] [address]   Urine rapid drug screen (hosp performed)     Status: None   Collection Time: 10/11/22  4:14 PM  Result Value Ref Range   Opiates NONE DETECTED NONE DETECTED   Cocaine NONE DETECTED NONE DETECTED   Benzodiazepines NONE DETECTED NONE DETECTED   Amphetamines NONE DETECTED NONE DETECTED   Tetrahydrocannabinol NONE DETECTED NONE DETECTED   Barbiturates NONE DETECTED NONE DETECTED    Comment: (NOTE) DRUG SCREEN FOR MEDICAL PURPOSES ONLY.  IF CONFIRMATION IS NEEDED FOR ANY PURPOSE, NOTIFY LAB WITHIN 5 DAYS.  LOWEST DETECTABLE LIMITS FOR URINE DRUG SCREEN Drug Class                     Cutoff (ng/mL) Amphetamine and metabolites    1000 Barbiturate and metabolites    200 Benzodiazepine                 200 Opiates and metabolites        300 Cocaine and metabolites        300 THC                            50 Performed at [facility], [place] [address], [place], [place] [address]   CBC with Diff     Status: Abnormal   Collection Time: 10/11/22  4:14 PM  Result Value Ref Range   WBC 4.6 4.0 - 10.5 K/uL   RBC 4.27 3.87 - 5.11 MIL/uL   Hemoglobin 13.6 12.0 - 15.0 g/dL   HCT 43.3 36.0 - 46.0 %   MCV 101.4 (H) 80.0 - 100.0 fL   MCH 31.9 26.0 - 34.0 pg   MCHC 31.4 30.0 - 36.0 g/dL   RDW 13.3 11.5 - 15.5 %   Platelets 251 150 -  400 K/uL   nRBC 0.0 0.0 - 0.2 %   Neutrophils Relative % 58 %   Neutro Abs 2.7 1.7 - 7.7 K/uL   Lymphocytes Relative 30 %   Lymphs Abs 1.4 0.7 - 4.0 K/uL   Monocytes Relative 9 %   Monocytes Absolute 0.4 0.1 - 1.0 K/uL   Eosinophils Relative 2 %   Eosinophils Absolute 0.1 0.0 - 0.5 K/uL   Basophils Relative 1 %   Basophils Absolute 0.0 0.0 - 0.1 K/uL   Immature Granulocytes 0 %   Abs Immature Granulocytes 0.01 0.00 - 0.07 K/uL    Comment: Performed at [facility], [place] [address] [place], [place] [address]  Resp panel by RT-PCR (RSV, Flu A&B, Covid) Anterior Nasal Swab     Status: None   Collection Time: 10/11/22  4:14 PM   Specimen: Anterior Nasal Swab  Result Value Ref Range   SARS Coronavirus 2 by RT PCR NEGATIVE NEGATIVE    Comment: (NOTE) SARS-CoV-2 target nucleic acids are NOT DETECTED.  The SARS-CoV-2 RNA is generally detectable in upper respiratory specimens during the acute phase of infection. The lowest concentration of SARS-CoV-2 viral copies this assay can detect is 138 copies/mL. A negative result does not preclude SARS-Cov-2 infection and should not be used as the sole basis for treatment or other patient management decisions. A negative result may occur with  improper specimen collection/handling, submission of specimen other than nasopharyngeal swab, presence of viral mutation(s) within the areas targeted by this assay, and  inadequate number of viral copies(<138 copies/mL). A negative result must be combined with clinical observations, patient history, and epidemiological information. The expected result is Negative.  Fact Sheet for Patients:  EntrepreneurPulse.com.au  Fact Sheet for Healthcare Providers:  IncredibleEmployment.be  This test is no t yet approved or cleared by the [place] FDA and  has been authorized for detection and/or diagnosis of SARS-CoV-2 by FDA under an Emergency Use  Authorization (EUA). This EUA will remain  in effect (meaning this test can be used) for the duration of the COVID-19 declaration under Section 564(b)(1) of the Act, 21 U.S.C.section 360bbb-3(b)(1), unless the authorization is terminated  or revoked sooner.       Influenza A by PCR NEGATIVE NEGATIVE   Influenza B by PCR NEGATIVE NEGATIVE    Comment: (NOTE) The Xpert Xpress SARS-CoV-2/FLU/RSV plus assay is intended as an aid in the diagnosis of influenza from Nasopharyngeal swab specimens and should not be used as a sole basis for treatment. Nasal washings and aspirates are unacceptable for Xpert Xpress SARS-CoV-2/FLU/RSV testing.  Fact Sheet for Patients: EntrepreneurPulse.com.au  Fact Sheet for Healthcare Providers: IncredibleEmployment.be  This test is not yet approved or cleared by the [place] FDA and has been authorized for detection and/or diagnosis of SARS-CoV-2 by FDA under an Emergency Use Authorization (EUA). This EUA will remain in effect (meaning this test can be used) for the duration of the COVID-19 declaration under Section 564(b)(1) of the Act, 21 U.S.C. section 360bbb-3(b)(1), unless the authorization is terminated or revoked.     Resp Syncytial Virus by PCR NEGATIVE NEGATIVE    Comment: (NOTE) Fact Sheet for Patients: EntrepreneurPulse.com.au  Fact Sheet for Healthcare Providers: IncredibleEmployment.be  This test is not yet approved or cleared by the [place] FDA and has been authorized for detection and/or diagnosis of SARS-CoV-2 by FDA under an Emergency Use Authorization (EUA). This EUA will remain in effect (meaning this test can be used) for the duration of the COVID-19 declaration under Section 564(b)(1) of the Act, 21 U.S.C. section 360bbb-3(b)(1), unless the authorization is terminated or revoked.  Performed at [facility], [place] [address], [place], [place] [id number]   Salicylate level     Status: Abnormal   Collection Time: 10/11/22  4:14 PM  Result Value Ref Range   Salicylate Lvl Q000111Q (L) 7.0 - 30.0 mg/dL    Comment: Performed at [facility], [place] [address], [place], [place] [address]  Acetaminophen level     Status: Abnormal   Collection Time: 10/11/22  4:14 PM  Result Value Ref Range   Acetaminophen (Tylenol), Serum <10 (L) 10 - 30 ug/mL    Comment: (NOTE) Therapeutic concentrations vary significantly. A range of 10-30 ug/mL  may be an effective concentration for many patients. However, some  are best treated at concentrations outside of this range. Acetaminophen concentrations >150 ug/mL at 4 hours after ingestion  and >50 ug/mL at 12 hours after ingestion are often associated with  toxic reactions.  Performed at [facility], [place] [address], [place], [place] [address]   Urinalysis, Routine w reflex microscopic -Urine, Clean Catch     Status: Abnormal   Collection Time: 10/11/22  4:14 PM  Result Value Ref Range   Color, Urine STRAW (A) YELLOW   APPearance CLEAR CLEAR   Specific Gravity, Urine 1.008 1.005 - 1.030   pH 5.0 5.0 - 8.0   Glucose, UA NEGATIVE NEGATIVE mg/dL   Hgb urine dipstick NEGATIVE NEGATIVE  Bilirubin Urine NEGATIVE NEGATIVE   Ketones, ur 20 (A) NEGATIVE mg/dL   Protein, ur NEGATIVE NEGATIVE mg/dL   Nitrite NEGATIVE NEGATIVE   Leukocytes,Ua NEGATIVE NEGATIVE    Comment: Performed at [facility], [place] [address] [place], [place] [address]  HIV Antibody (routine testing w rflx)     Status: None   Collection Time: 10/11/22  4:14 PM  Result Value Ref Range   HIV Screen 4th Generation wRfx Non Reactive Non Reactive    Comment: Performed at [facility], [place] [address], [place], [place] [address]  RPR     Status: None   Collection Time: 10/11/22  5:00 PM  Result Value Ref Range   RPR Ser Ql NON REACTIVE NON REACTIVE     Comment: Performed at [facility], [place] [address] [place], [place] [address]    Current Facility-Administered Medications  Medication Dose Route Frequency Provider Last Rate Last Admin   OLANZapine zydis (ZYPREXA) disintegrating tablet 5 mg  5 mg Oral Once PRN Motley-Mangrum, Al Pimple, PMHNP       [START ON 10/13/2022] sertraline (ZOLOFT) tablet 25 mg  25 mg Oral q AM Motley-Mangrum, Brenner Visconti A, PMHNP       Current Outpatient Medications  Medication Sig Dispense Refill   acetaminophen (TYLENOL) 500 MG tablet Take 500 mg by mouth 4 (four) times daily.     docusate sodium (COLACE) 100 MG capsule Take 100 mg by mouth See admin instructions. Take 100 mg by mouth in the morning and hold for loose stools     Multiple Vitamin (MULTIVITAMIN) tablet Take 1 tablet by mouth daily with breakfast.     OLANZapine zydis (ZYPREXA) 10 MG disintegrating tablet Take 15 mg by mouth at bedtime.     omeprazole (PRILOSEC) 20 MG capsule Take 20 mg by mouth daily before breakfast.     polyethylene glycol powder (GLYCOLAX/MIRALAX) 17 GM/SCOOP powder Take 17 g by mouth daily as needed for mild constipation (mix and drink).     QUEtiapine (SEROQUEL) 100 MG tablet Take 300 mg by mouth at bedtime.     sertraline (ZOLOFT) 25 MG tablet Take 25 mg by mouth in the morning.     triamcinolone cream (KENALOG) 0.1 % Apply 1 Application topically See admin instructions. Apply topically to affected area(s) once a day     zolpidem (AMBIEN) 10 MG tablet Take 10 mg by mouth at bedtime.      Musculoskeletal: Strength & Muscle Tone: within normal limits Gait & Station: normal Patient leans: N/A   Psychiatric Specialty Exam: Presentation  General Appearance:  Disheveled  Eye Contact: Minimal  Speech: Clear and Coherent  Speech Volume: Normal  Handedness: Right   Mood and Affect  Mood: Irritable  Affect: Blunt; Labile   Thought Process  Thought Processes: Disorganized  Descriptions of  Associations:Loose  Orientation:Partial  Thought Content:Paranoid Ideation; Scattered  History of Schizophrenia/Schizoaffective disorder:Yes  Duration of Psychotic Symptoms:Greater than six months  Hallucinations:Hallucinations: None  Ideas of Reference:Paranoia  Suicidal Thoughts:Suicidal Thoughts: No  Homicidal Thoughts:Homicidal Thoughts: Yes, Passive HI Passive Intent and/or Plan: Without Intent; Without Plan   Sensorium  Memory: Immediate Fair; Remote Poor  Judgment: Poor  Insight: Lacking   Executive Functions  Concentration: Fair  Attention Span: Fair  Recall: [employer] of Knowledge: Fair  Language: Fair   Psychomotor Activity  Psychomotor Activity: Psychomotor Activity: Normal   Assets  Assets: Armed forces logistics/support/administrative officer; Social Support    Sleep  Sleep: Sleep: Fair  Physical Exam: Physical Exam Musculoskeletal:        General: Normal range of motion.  Neurological:     Mental Status: She is alert.  Psychiatric:        Attention and Perception: She is inattentive.        Mood and Affect: Mood is anxious. Affect is labile.        Speech: Speech normal.        Behavior: Behavior is agitated.        Thought Content: Thought content includes homicidal ideation.        Judgment: Judgment is impulsive and inappropriate.    Review of Systems  Constitutional: Negative.   HENT: Negative.    Musculoskeletal: Negative.   Psychiatric/Behavioral:         Mania, paranoid, delusional    Blood pressure (!) 132/95, pulse 77, temperature 98.5 F (36.9 C), temperature source Oral, resp. rate 18, height '5\' 5"'$  (1.651 m), weight 66.7 kg, SpO2 100 %. Body mass index is 24.46 kg/m.   Medical Decision Making: Patient case review and discussed with Dr. Dwyane Dee.  Patient continues in the inpatient psychiatric admission for stabilization and treatment.  Patient has been accepted to [facility] and will be leaving tonight at 6 PM.  Zyprexa Zydis  5 mg p.o. ordered Once PRN for agitation.   Disposition: Recommend psychiatric Inpatient admission when medically cleared.   Michaele Offer, PMHNP 10/12/2022 4:32 PM

## 2022-10-12 NOTE — Progress Notes (Signed)
Pt was accepted to [facility] 10/12/[facility], pending IVC paperwork faxed to [phone]  Pt meets inpatient criteria per Michaele Offer, NP  Attending Physician will be Brantley Fling, MD  Report can be called to: [phone]  Pt can arrive after 6 PM  Care Team Notified: Michaele Offer, NP and Corinna Gab, RN  [place], [place]  10/12/2022 11:03 AM

## 2022-10-12 NOTE — Progress Notes (Signed)
LCSW Progress Note  [id number]   Deanna Rhodes  10/12/2022  10:28 AM  Description:   Inpatient Psychiatric Referral  Patient was recommended inpatient per [facility], NP. There are no available beds at [facility] unit. Patient was referred to the following facilities:   Destination  Service Provider Address Phone Fax  [facility]  [address] [place] [address] [phone]  [place], [place] [id number] [phone]  [facility] [place]  [place], [place] [address] [phone] [place]  [address] [place] [address] [phone]  [facility]  [address] [place], [place] [address] [phone] [facility]  [id number] [place] [address] [phone] [facility]  [place] [address] [phone] [facility]  [address] [place] [address] [phone]  [facility]  [address], [place] [address] [phone] [place]  [address], [place] [address] [phone] [facility]  [address] [place] [address] [phone] [facility]  [address], [place] [address] [place]  [address] [place] [address] [phone] [facility]  [address] [place] [address] [phone]  [facility]  [address], [place] [address] [phone]  [place]  [address] [place]  [address] [id number]  [facility]  [address] [place] [address] [place]  [facility]  [address] [place] [address] [phone] [place]  [address] [place] [address] [phone]  [facility]  [address], [place] [address] [phone]  [facility]  [place], [place] [address] [phone]  [place]  [address], [place] [address] [phone] [facility]  [address], [place] [address] [phone] [facility]  [place], [place]  [id number]    Situation ongoing, CSW to continue following and update chart as more information becomes available.      Denna Haggard, [place]  10/12/2022 10:28 AM

## 2022-10-12 NOTE — ED Notes (Signed)
Transport called.

## 2022-10-12 NOTE — ED Notes (Signed)
TTS completed recommendation for geri-psych admission SW is looking for placement. Looking at [facility].

## 2022-10-12 NOTE — ED Notes (Signed)
Verbally threatening to staff stated " dont F--k with me I will f--k you up by kicking you in the groin.
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
## 2022-12-13 NOTE — Progress Notes (Signed)
Patient presented with hallucinations and delusions. She required medical clearance for psych eval in which our order set includes UDS and ETOH as these are common causes of hallucinations as well.

## 2023-08-01 DEATH — deceased

## 2024-04-07 IMAGING — CT CT HEAD W/O CM
4 series · 16 of 47 positions shown, 18 images · non-contrast
Comparison: None Available.

CLINICAL DATA: 71-year-old female status post fall at [HOSPITAL].
Weakness.



[Series 2: head wo · axial · 0.47mm/px · z∈[-140,-25]mm · 7 of 31 slices shown, 9 images]
[im 4/31  brain]
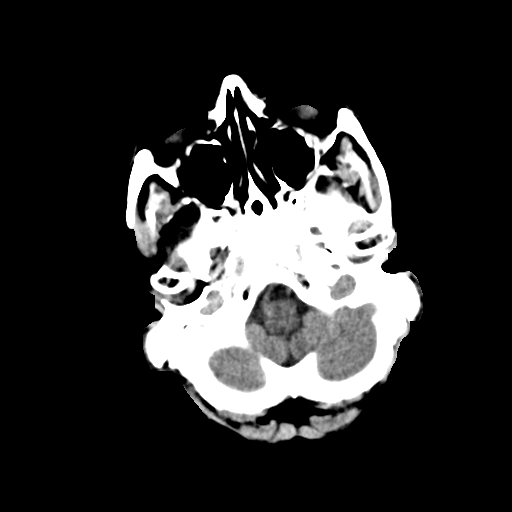
[im 4/31  bone]
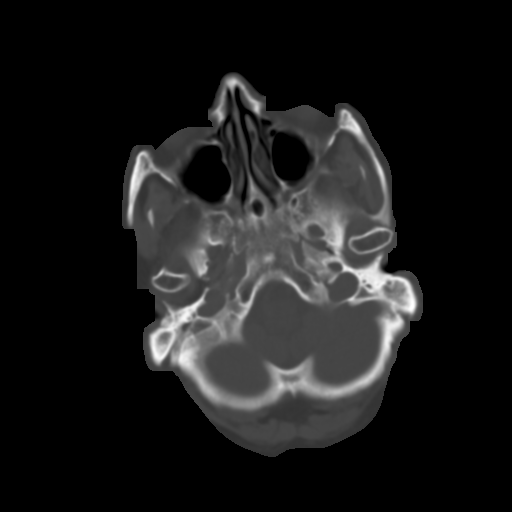
[im 8/31  brain]
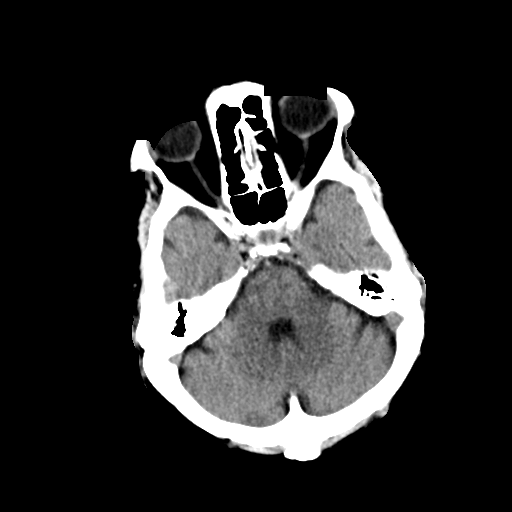
[im 12/31  brain]
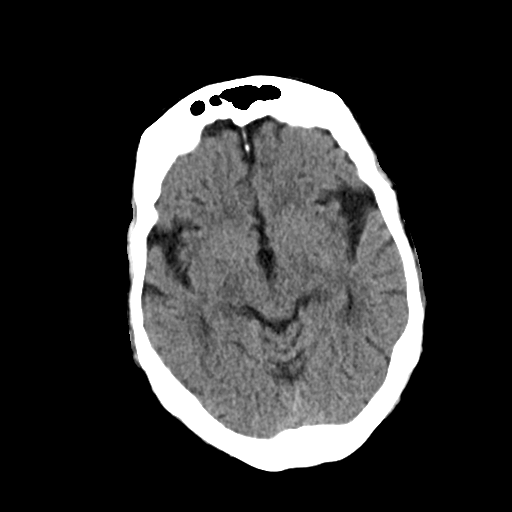
[im 16/31  brain]
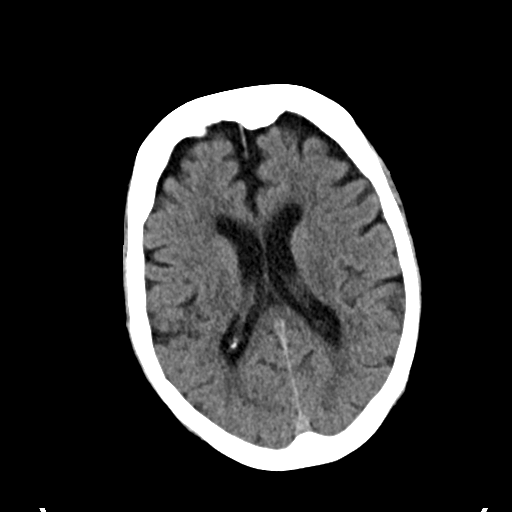
[im 19/31  brain]
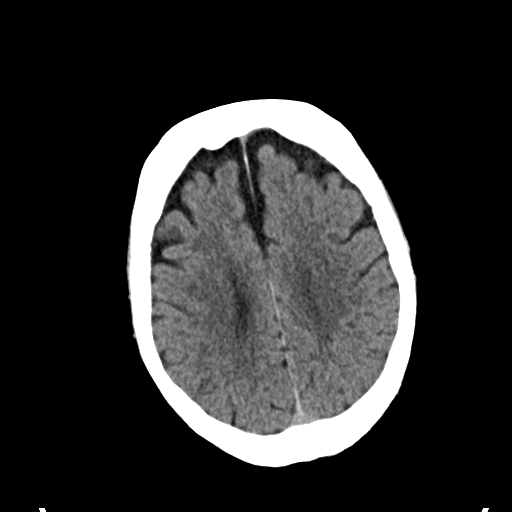
[im 19/31  bone]
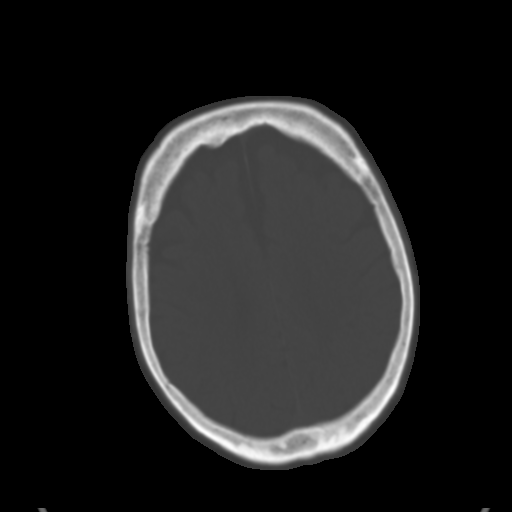
[im 23/31  brain]
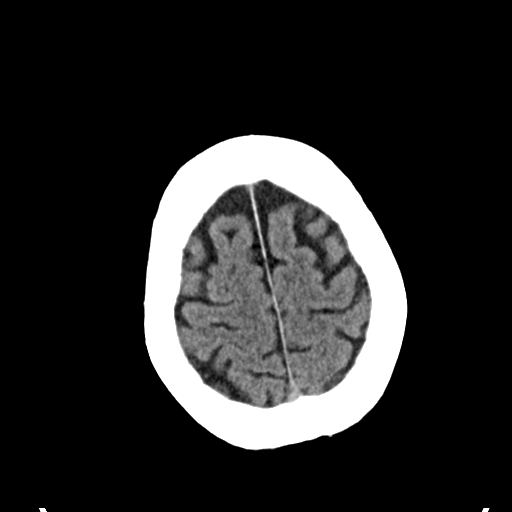
[im 27/31  brain]
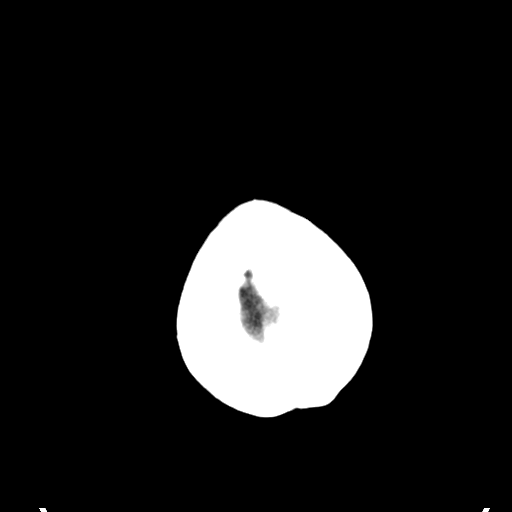

[Series 3: head bone · axial · 0.47mm/px · z∈[-141,-111]mm · 3 of 77 slices shown]
[im 8/77  bone]
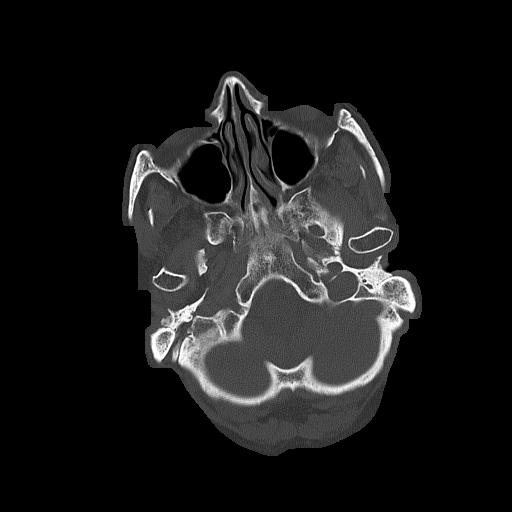
[im 16/77  bone]
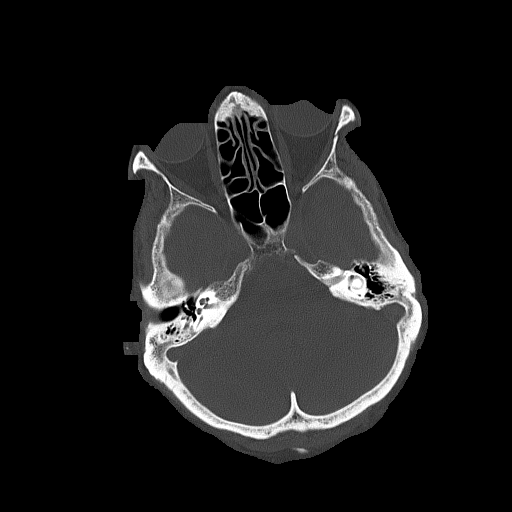
[im 23/77  bone]
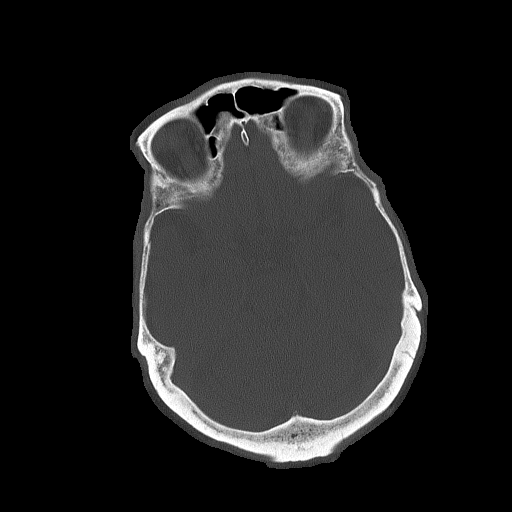

[Series 4: coronal soft tissue · coronal · 0.28mm/px · 3 of 67 slices shown]
[im 23/67  brain]
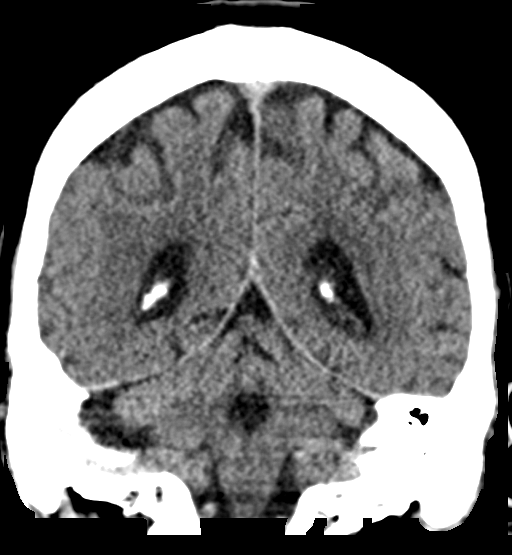
[im 30/67  brain]
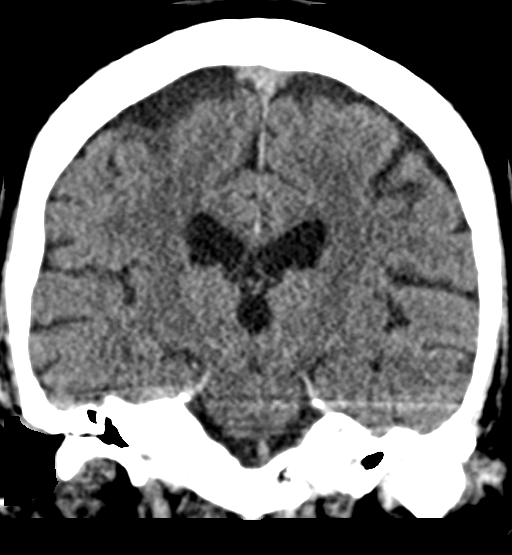
[im 37/67  brain]
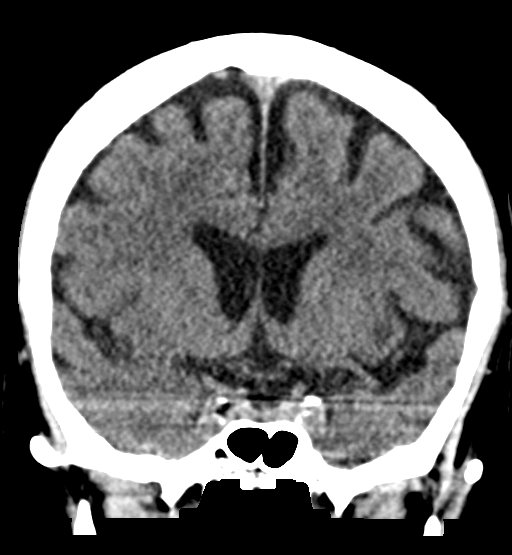

[Series 5: sagittal soft tissue · sagittal · 0.31mm/px · 3 of 49 slices shown]
[im 17/49  brain]
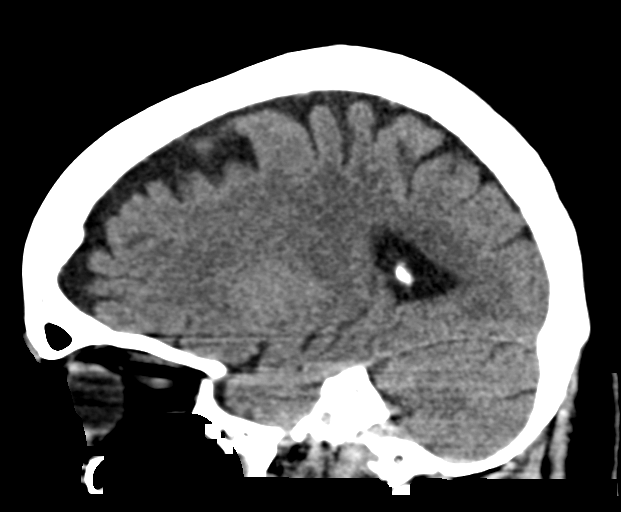
[im 25/49  brain]
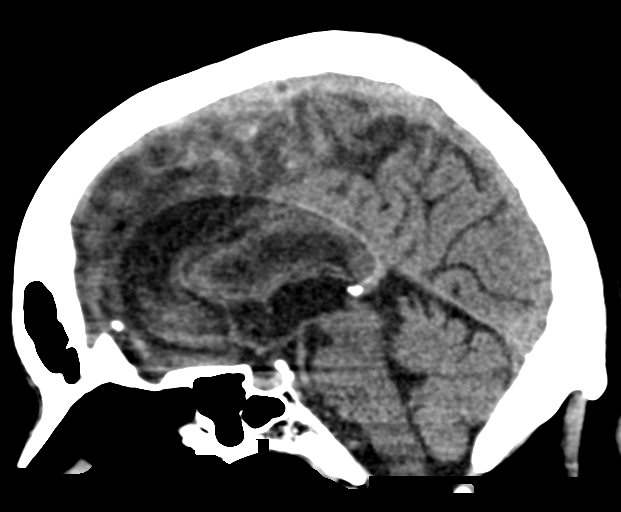
[im 33/49  brain]
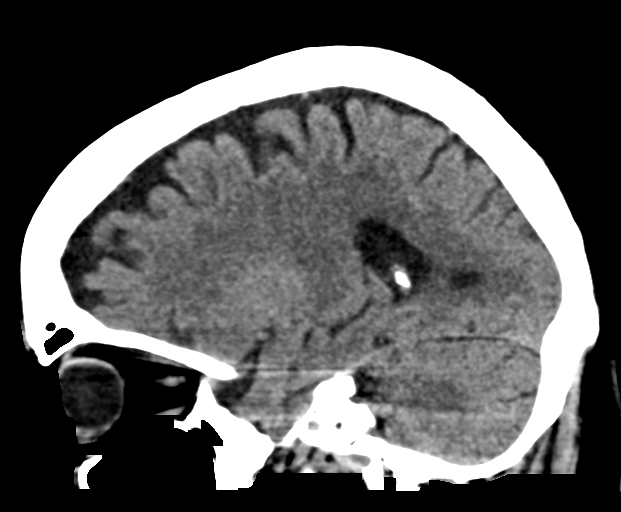

[16 of 47 positions shown; findings below may reference images not displayed]

FINDINGS: Brain: Cerebral volume is within normal limits for age. No midline
shift, ventriculomegaly, mass effect, evidence of mass lesion,
intracranial hemorrhage or evidence of cortically based acute
infarction. Minimal to mild for age scattered cerebral white matter
hypodensity, mostly subcortical. Mild involvement right anterior
internal capsule. Gray-white matter differentiation otherwise within
normal limits.

Vascular: Minimal Calcified atherosclerosis at the skull base. No
suspicious intracranial vascular hyperdensity.

Skull: No fracture identified. Hyperostosis of the calvarium, normal
variant.

Sinuses/Orbits: Paranasal sinuses, tympanic cavities and mastoids
are well aerated.

Other: No orbit or scalp soft tissue injury identified.
IMPRESSION: 1. No acute intracranial abnormality or acute traumatic injury
identified.
2. Minimal to mild for age cerebral white matter changes, most
commonly due to chronic small vessel disease.
